# Patient Record
Sex: Male | Born: 1960 | Race: White | Hispanic: Yes | Marital: Single | Smoking: Never smoker
Health system: Southern US, Community
[De-identification: ages and names within clinical notes are randomized; demographics above are authoritative.]

## PROBLEM LIST (undated history)

## (undated) DIAGNOSIS — E559 Vitamin D deficiency, unspecified: Secondary | ICD-10-CM

## (undated) DIAGNOSIS — R519 Headache, unspecified: Secondary | ICD-10-CM

## (undated) DIAGNOSIS — E7800 Pure hypercholesterolemia, unspecified: Secondary | ICD-10-CM

## (undated) DIAGNOSIS — H547 Unspecified visual loss: Secondary | ICD-10-CM

## (undated) DIAGNOSIS — J45909 Unspecified asthma, uncomplicated: Secondary | ICD-10-CM

## (undated) DIAGNOSIS — R7303 Prediabetes: Secondary | ICD-10-CM

## (undated) DIAGNOSIS — K219 Gastro-esophageal reflux disease without esophagitis: Secondary | ICD-10-CM

## (undated) DIAGNOSIS — E039 Hypothyroidism, unspecified: Secondary | ICD-10-CM

## (undated) DIAGNOSIS — I10 Essential (primary) hypertension: Secondary | ICD-10-CM

## (undated) DIAGNOSIS — W3400XA Accidental discharge from unspecified firearms or gun, initial encounter: Secondary | ICD-10-CM

## (undated) DIAGNOSIS — M1990 Unspecified osteoarthritis, unspecified site: Secondary | ICD-10-CM

## (undated) DIAGNOSIS — Z79899 Other long term (current) drug therapy: Secondary | ICD-10-CM

## (undated) DIAGNOSIS — R0600 Dyspnea, unspecified: Secondary | ICD-10-CM

## (undated) DIAGNOSIS — F32A Depression, unspecified: Secondary | ICD-10-CM

## (undated) HISTORY — DX: Vitamin D deficiency, unspecified: E55.9

## (undated) HISTORY — DX: Pure hypercholesterolemia, unspecified: E78.00

## (undated) HISTORY — DX: Unspecified asthma, uncomplicated: J45.909

## (undated) HISTORY — DX: Hypothyroidism, unspecified: E03.9

## (undated) HISTORY — PX: OTHER SURGICAL HISTORY: SHX169

## (undated) HISTORY — DX: Other long term (current) drug therapy: Z79.899

---
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
## 1982-05-23 DIAGNOSIS — W3400XA Accidental discharge from unspecified firearms or gun, initial encounter: Secondary | ICD-10-CM

## 1982-05-23 HISTORY — DX: Accidental discharge from unspecified firearms or gun, initial encounter: W34.00XA

## 1982-05-23 HISTORY — PX: EYE SURGERY: SHX253

## 2018-01-15 ENCOUNTER — Encounter (HOSPITAL_BASED_OUTPATIENT_CLINIC_OR_DEPARTMENT_OTHER): Payer: Self-pay | Admitting: *Deleted

## 2018-01-15 ENCOUNTER — Emergency Department (HOSPITAL_BASED_OUTPATIENT_CLINIC_OR_DEPARTMENT_OTHER)
Admission: EM | Admit: 2018-01-15 | Discharge: 2018-01-15 | Disposition: A | Discharge status: Home / Self Care | Payer: Medicaid Other | Attending: Emergency Medicine | Admitting: Emergency Medicine

## 2018-01-15 ENCOUNTER — Other Ambulatory Visit: Payer: Self-pay

## 2018-01-15 DIAGNOSIS — D171 Benign lipomatous neoplasm of skin and subcutaneous tissue of trunk: Secondary | ICD-10-CM | POA: Diagnosis not present

## 2018-01-15 DIAGNOSIS — R1901 Right upper quadrant abdominal swelling, mass and lump: Secondary | ICD-10-CM | POA: Diagnosis present

## 2018-01-15 DIAGNOSIS — I10 Essential (primary) hypertension: Secondary | ICD-10-CM | POA: Insufficient documentation

## 2018-01-15 HISTORY — DX: Essential (primary) hypertension: I10

## 2018-01-15 HISTORY — DX: Unspecified visual loss: H54.7

## 2018-01-15 NOTE — ED Triage Notes (Signed)
He has 2 quarter sized enlarged areas in his right upper abdominal quadrant for 6 months.

## 2018-01-15 NOTE — ED Provider Notes (Signed)
[facility] EMERGENCY DEPARTMENT Provider Note   CSN: [id number] Arrival date & time: 01/15/18  1608     History   Chief Complaint Chief Complaint  Patient presents with  . Skin Lesion    HPI Jeffery [name] is a 57 y.o. male.  HPI Jeffery [name] is a 57 y.o. male with hx of blindness and hypertension, presents to emergency department complaining of 2 swollen areas to his abdomen.  Patient states the areas have been there for several weeks.  He states they are not tender.  There is no swelling or erythema over them.  He is concerned that this could be an abscess, states he had one on his neck and his face in the past.  Denies any fever or chills.  No nausea or vomiting.  Eating drinking well.  No other complaints.  Past Medical History:  Diagnosis Date  . Blind   . Hypertension     There are no active problems to display for this patient.   History reviewed. No pertinent surgical history.      Home Medications    Prior to Admission medications   Not on File    Family History No family history on file.  Social History Social History   Tobacco Use  . Smoking status: Never Smoker  . Smokeless tobacco: Never Used  Substance Use Topics  . Alcohol use: Yes    Frequency: Never  . Drug use: Never     Allergies   Patient has no known allergies.   Review of Systems Review of Systems  Constitutional: Negative for chills and fever.  Respiratory: Negative for cough, chest tightness and shortness of breath.   Cardiovascular: Negative for chest pain, palpitations and leg swelling.  Gastrointestinal: Negative for abdominal distention, abdominal pain, diarrhea, nausea and vomiting.  Musculoskeletal: Negative for arthralgias, myalgias, neck pain and neck stiffness.  Skin: Positive for wound. Negative for rash.  Allergic/Immunologic: Negative for immunocompromised state.  Neurological: Negative for dizziness, weakness, light-headedness, numbness and headaches.  All  other systems reviewed and are negative.    Physical Exam Updated Vital Signs BP (!) 128/95   Pulse 68   Temp 98.2 F (36.8 C) (Oral)   Resp 18   Ht 5\' 6"  (1.676 m)   Wt 84.8 kg   SpO2 99%   BMI 30.18 kg/m   Physical Exam  Constitutional: He appears well-developed and well-nourished. No distress.  Eyes: Conjunctivae are normal.  Neck: Neck supple.  Cardiovascular: Normal rate.  Pulmonary/Chest: No respiratory distress.  Abdominal: He exhibits no distension. There is no tenderness.    Two small superficial firm, mobile nodules to the right of the abdomen, most consistent with lipomas.  Area is nontender, no erythema, no drainage, is not warm to the touch.  No skin discoloration at all.  Skin: Skin is warm and dry.  Nursing note and vitals reviewed.    ED Treatments / Results  Labs (all labs ordered are listed, but only abnormal results are displayed) Labs Reviewed - No data to display  EKG None  Radiology No results found.  Procedures Procedures (including critical care time)  Medications Ordered in ED Medications - No data to display   Initial Impression / Assessment and Plan / ED Course  I have reviewed the triage vital signs and the nursing notes.  Pertinent labs & imaging results that were available during my care of the patient were reviewed by me and considered in my medical decision making (see chart for  details).     With what appears to be 2 lipomas to the abdomen.  They are nontender.  He is concerned this could be an abscess, however it is not warm, not erythematous, oval in shape, mobile, nontender.  We discussed observation.  Ibuprofen or Tylenol for pain if needed.  Follow-up with primary care doctor.  Patient agreed.  He has no systemic symptoms, no other complaints.  Vitals:   01/15/18 1615 01/15/18 1617  BP:  (!) 128/95  Pulse:  68  Resp:  18  Temp:  98.2 F (36.8 C)  TempSrc:  Oral  SpO2:  99%  Weight: 84.8 kg   Height: 5\' 6"   (1.676 m)      Final Clinical Impressions(s) / ED Diagnoses   Final diagnoses:  Lipoma of torso    ED Discharge Orders    None       Jeannett Senior, PA-C 01/15/18 2305    Fredia Sorrow, MD 01/21/18 [phone]

## 2018-01-15 NOTE — Discharge Instructions (Addendum)
Tylenol or Motrin for pain.  Avoid touching the area to stop the inflammation.  Follow-up with your doctor as needed.

## 2018-10-16 ENCOUNTER — Ambulatory Visit: Payer: Self-pay | Admitting: Orthopedic Surgery

## 2018-10-19 ENCOUNTER — Ambulatory Visit: Payer: Self-pay | Admitting: Orthopedic Surgery

## 2018-10-19 NOTE — H&P (Unsigned)
Subjective:   Jeffery [name] is a pleasant 58 yo male with no significant PMH who is scheduled for a left L5/S1 micro discectomy on 10/25/2018 at [facility] for L5/S1 discectomy with severe debilitating left leg pain which has not improved with medications. Fortunately he has no weakness or changes in his bladder or bowel function. However, the pain is 10/10, severe, and debilitating and the patient would like to move forward with surgery. Surgical clearance from PCP has been obtained. Patient has LSO brace.  Past Medical History:  Diagnosis Date  . Blind   . Hypertension     No past surgical history on file.  No current outpatient medications on file.   No current facility-administered medications for this visit.    No Known Allergies  Social History   Tobacco Use  . Smoking status: Never Smoker  . Smokeless tobacco: Never Used  Substance Use Topics  . Alcohol use: Yes    Frequency: Never    No family history on file.  Review of Systems As stated in HPI  Objective:   General: AAOX3, well developed and well nourished, NAD Ambulation antalgic uses no assitive devices  Heart: RRR, no rubs, murmers, or gallops.  Lungs: CTAB  Abdomen: Normal bowel soundsX4, soft, non-tender, no hepatosplenomegaly.  ROM: - Knee: flexion and extension normal and pain free bilaterally. - Ankle: Dorsiflexion, plantarflexion, inversion, eversion normal and pain free.  Dermatomes: Lower extremity sensation to light touch abnormal with positive dysathesias on the left L5/S1 dermatome pattern.  Myotomes: - Hip Flexion: Left 5/5, Right 5/5 -Hip Adduction: Left 5/5, Right 5/5 - Knee Extension: Left 5/5, Right 5/5 - Knee Flexion: Left 5/5, Right 5/5 - Ankle Dorsiflextion: Left 5/5, Right 5/5 - Ankle Eversion: Left 5/5, Right 5/5 - Ankle Plantarflexion: Left 5/5, Right 5/5  Reflexes: - Patella: Left2+, Right 2+ - Achilles: Left2+, Right 2+ - Babinski: Left Ngative, Right Negative - Clonus:  Negative  Special Tests:  - Straight Leg Raise: Left Positive, Right Negative  PV: Extremities warm and well profused. Posterior and dorsalis pedis pulse 2+ bilaterally, No pitting Edema, discoloration, calf tenderness, or palpable cords. Homan's negative bilaterally.    X-Ray impression: No x-rays at today's visit  Lumbar MRI: completed on 10/01/18 was reviewed with the patient. I have also reviewed the radiology report. Large left disc herniation measuring 13 x 14 x 17 mm posterior lateral to the left at L5-S1. There is significant displacement and compression of the traversing S1 nerve root. Small broad-based disc protrusions at L3-4 and L4-5 but no high-grade neural compression. No acute fracture seen. No evidence of abnormal marrow replacing lesion.  Assessment:   Jeffery [name] is a pleasant 58 yo male with no significant PMH who is scheduled for a left L5/S1 micro discectomy on 10/25/2018 at [facility] for L5/S1 discectomy with severe debilitating left leg pain which has not improved with medications. At this point time the patient has severe debilitating radicular leg pain consistent with S1 radiculopathy. Imaging studies confirmed a large left disc herniation causing compression of the S1 nerve root. We did briefly discussed injection therapy but given the size of the disc herniation and the severity of the nerve compression I am not optimistic that that treatment plan would result in significant improvement. The patient would like to move forward with surgery which I think gives him the best chance at relieving his radicular leg pain.  Plan:   Treatment plan: Left L5-S1 discectomy At this point time the patient has  severe debilitating radicular leg pain consistent with S1 radiculopathy. Imaging studies confirmed a large left disc herniation causing compression of the S1 nerve root. We did briefly discussed injection therapy but given the size of the disc herniation and the severity of the  nerve compression I am not optimistic that that treatment plan would result in significant improvement. The patient would like to move forward with surgery which I think gives him the best chance at relieving his radicular leg pain.  Surgical plan is L5-S1 discectomy. I have briefly gone over the surgical procedure as well as the risks and benefits. Risks and benefits of surgery were discussed with the patient. These include: Infection, bleeding, death, stroke, paralysis, ongoing or worse pain, need for additional surgery, leak of spinal fluid, adjacent segment degeneration requiring additional surgery, post-operative hematoma formation that can result in neurological compromise and the need for urgent/emergent re-operation. Loss in bowel and bladder control. Injury to major vessels that could result in the need for urgent abdominal surgery to stop bleeding. Risk of deep venous thrombosis (DVT) and the need for additional treatment. Recurrent disc herniation resulting in the need for revision surgery, which could include fusion surgery (utilizing instrumentation such as pedicle screws and intervertebral cages).  All of patients questions invited and answered.  Treatment plan: Left L5-S1 discectomy on 10/25/2018  F/u: 2 weeks s/p surgery.

## 2018-10-22 NOTE — Progress Notes (Signed)
[place], [place] [address] Phone: [phone] Fax: [phone]      Your procedure is scheduled on June 4  Report to [facility] Main Entrance "A" at 1130 A.M., and check in at the Admitting office.  Call this number if you have problems the morning of surgery:  [phone]  Call [phone] if you have any questions prior to your surgery date Monday-Friday 8am-4pm    Remember:  Do not eat or drink after midnight.     Take these medicines the morning of surgery with A SIP OF WATER  albuterol (VENTOLIN HFA) 108 (90 Base) if needed gabapentin (NEURONTIN)  oxyCODONE-acetaminophen (PERCOCET) if needed for pain Eye drops if needed  7 days prior to surgery STOP taking any Aspirin (unless otherwise instructed by your surgeon), Aleve, Naproxen, Ibuprofen, Motrin, Advil, Goody's, BC's, all herbal medications, fish oil, and all vitamins.    The Morning of Surgery  Do not wear jewelry, make-up or nail polish.  Do not wear lotions, powders, or perfumes/colognes, or deodorant  Do not shave 48 hours prior to surgery.  Men may shave face and neck.  Do not bring valuables to the hospital.  [facility] is not responsible for any belongings or valuables.  If you are a smoker, DO NOT Smoke 24 hours prior to surgery IF you wear a CPAP at night please bring your mask, tubing, and machine the morning of surgery   Remember that you must have someone to transport you home after your surgery, and remain with you for 24 hours if you are discharged the same day.   Contacts, glasses, hearing aids, dentures or bridgework may not be worn into surgery.    Leave your suitcase in the car.  After surgery it may be brought to your room.  For patients admitted to the hospital, discharge time will be determined by your treatment team.  Patients discharged the day of surgery will not be allowed  to drive home.    Special instructions:   [facility]- Preparing For Surgery  Before surgery, you can play an important role. Because skin is not sterile, your skin needs to be as free of germs as possible. You can reduce the number of germs on your skin by washing with CHG (chlorahexidine gluconate) Soap before surgery.  CHG is an antiseptic cleaner which kills germs and bonds with the skin to continue killing germs even after washing.    Oral Hygiene is also important to reduce your risk of infection.  Remember - BRUSH YOUR TEETH THE MORNING OF SURGERY WITH YOUR REGULAR TOOTHPASTE  Please do not use if you have an allergy to CHG or antibacterial soaps. If your skin becomes reddened/irritated stop using the CHG.  Do not shave (including legs and underarms) for at least 48 hours prior to first CHG shower. It is OK to shave your face.  Please follow these instructions carefully.   1. Shower the NIGHT BEFORE SURGERY and the MORNING OF SURGERY with CHG Soap.   2. If you chose to wash your hair, wash your hair first as usual with your normal shampoo.  3. After you shampoo, rinse your hair and body thoroughly to remove the shampoo.  4. Use CHG as you would any other liquid soap. You can apply CHG directly to the skin and wash gently with a scrungie or a clean washcloth.   5. Apply  the CHG Soap to your body ONLY FROM THE NECK DOWN.  Do not use on open wounds or open sores. Avoid contact with your eyes, ears, mouth and genitals (private parts). Wash Face and genitals (private parts)  with your normal soap.   6. Wash thoroughly, paying special attention to the area where your surgery will be performed.  7. Thoroughly rinse your body with warm water from the neck down.  8. DO NOT shower/wash with your normal soap after using and rinsing off the CHG Soap.  9. Pat yourself dry with a CLEAN TOWEL.  10. Wear CLEAN PAJAMAS to bed the night before surgery, wear comfortable clothes the morning of  surgery  11. Place CLEAN SHEETS on your bed the night of your first shower and DO NOT SLEEP WITH PETS.    Day of Surgery:  Do not apply any deodorants/lotions.  Please wear clean clothes to the hospital/surgery center.   Remember to brush your teeth WITH YOUR REGULAR TOOTHPASTE.   Please read over the following fact sheets that you were given.

## 2018-10-23 ENCOUNTER — Ambulatory Visit (HOSPITAL_COMMUNITY)
Admission: RE | Admit: 2018-10-23 | Discharge: 2018-10-23 | Disposition: A | Discharge status: Home / Self Care | Payer: Medicare Other | Source: Ambulatory Visit | Attending: Orthopedic Surgery | Admitting: Orthopedic Surgery

## 2018-10-23 ENCOUNTER — Other Ambulatory Visit (HOSPITAL_COMMUNITY)
Admission: RE | Admit: 2018-10-23 | Discharge: 2018-10-23 | Disposition: A | Discharge status: Home / Self Care | Payer: Medicare Other | Source: Ambulatory Visit | Attending: Orthopedic Surgery | Admitting: Orthopedic Surgery

## 2018-10-23 ENCOUNTER — Encounter (HOSPITAL_COMMUNITY)
Admission: RE | Admit: 2018-10-23 | Discharge: 2018-10-23 | Disposition: A | Discharge status: Home / Self Care | Payer: Medicare Other | Source: Ambulatory Visit | Attending: Orthopedic Surgery | Admitting: Orthopedic Surgery

## 2018-10-23 ENCOUNTER — Encounter (HOSPITAL_COMMUNITY): Payer: Self-pay

## 2018-10-23 ENCOUNTER — Other Ambulatory Visit: Payer: Self-pay

## 2018-10-23 DIAGNOSIS — Z01818 Encounter for other preprocedural examination: Secondary | ICD-10-CM | POA: Insufficient documentation

## 2018-10-23 DIAGNOSIS — J45909 Unspecified asthma, uncomplicated: Secondary | ICD-10-CM | POA: Diagnosis not present

## 2018-10-23 DIAGNOSIS — I10 Essential (primary) hypertension: Secondary | ICD-10-CM | POA: Insufficient documentation

## 2018-10-23 DIAGNOSIS — Z79899 Other long term (current) drug therapy: Secondary | ICD-10-CM | POA: Diagnosis not present

## 2018-10-23 DIAGNOSIS — H547 Unspecified visual loss: Secondary | ICD-10-CM | POA: Diagnosis not present

## 2018-10-23 DIAGNOSIS — Z1159 Encounter for screening for other viral diseases: Secondary | ICD-10-CM | POA: Diagnosis not present

## 2018-10-23 DIAGNOSIS — M5127 Other intervertebral disc displacement, lumbosacral region: Secondary | ICD-10-CM | POA: Diagnosis not present

## 2018-10-23 HISTORY — DX: Unspecified asthma, uncomplicated: J45.909

## 2018-10-23 LAB — PROTIME-INR
INR: 1.1 (ref 0.8–1.2)
Prothrombin Time: 14.1 seconds (ref 11.4–15.2)

## 2018-10-23 LAB — SURGICAL PCR SCREEN
MRSA, PCR: NEGATIVE
Staphylococcus aureus: NEGATIVE

## 2018-10-23 LAB — URINALYSIS, ROUTINE W REFLEX MICROSCOPIC
Bilirubin Urine: NEGATIVE
Glucose, UA: NEGATIVE mg/dL
Hgb urine dipstick: NEGATIVE
Ketones, ur: NEGATIVE mg/dL
Leukocytes,Ua: NEGATIVE
Nitrite: NEGATIVE
Protein, ur: NEGATIVE mg/dL
Specific Gravity, Urine: 1.017 (ref 1.005–1.030)
pH: 6 (ref 5.0–8.0)

## 2018-10-23 LAB — BASIC METABOLIC PANEL
Anion gap: 9 (ref 5–15)
BUN: 14 mg/dL (ref 6–20)
CO2: 25 mmol/L (ref 22–32)
Calcium: 9.3 mg/dL (ref 8.9–10.3)
Chloride: 106 mmol/L (ref 98–111)
Creatinine, Ser: 1.02 mg/dL (ref 0.61–1.24)
GFR calc Af Amer: >60 mL/min (ref >60)
GFR calc non Af Amer: >60 mL/min (ref >60)
Glucose, Bld: 77 mg/dL (ref 70–99)
Potassium: 4.1 mmol/L (ref 3.5–5.1)
Sodium: 140 mmol/L (ref 135–145)

## 2018-10-23 LAB — APTT: aPTT: 29 seconds (ref 24–36)

## 2018-10-23 LAB — CBC
HCT: 47.6 % (ref 39.0–52.0)
Hemoglobin: 16.2 g/dL (ref 13.0–17.0)
MCH: 32.7 pg (ref 26.0–34.0)
MCHC: 34 g/dL (ref 30.0–36.0)
MCV: 96 fL (ref 80.0–100.0)
Platelets: 206 10*3/uL (ref 150–400)
RBC: 4.96 MIL/uL (ref 4.22–5.81)
RDW: 11.4 % — ABNORMAL LOW (ref 11.5–15.5)
WBC: 7.1 10*3/uL (ref 4.0–10.5)
nRBC: 0 % (ref 0.0–0.2)

## 2018-10-23 NOTE — Progress Notes (Signed)
PCP - Kellie Shropshire Cardiologist - denies  Chest x-ray - 10/23/18 EKG - requesting Stress Test - denies ECHO - denies Cardiac Cath - denies   Anesthesia review: yes, records requested   Patient denies shortness of breath, fever, cough and chest pain at PAT appointment   Patient verbalized understanding of instructions that were given to them at the PAT appointment. Patient was also instructed that they will need to review over the PAT instructions again at home before surgery.

## 2018-10-24 ENCOUNTER — Encounter (HOSPITAL_COMMUNITY): Payer: Self-pay | Admitting: Vascular Surgery

## 2018-10-24 LAB — NOVEL CORONAVIRUS, NAA (HOSP ORDER, SEND-OUT TO REF LAB; TAT 18-24 HRS): SARS-CoV-2, NAA: NOT DETECTED

## 2018-10-24 NOTE — Anesthesia Preprocedure Evaluation (Deleted)
Anesthesia Evaluation    Airway        Dental   Pulmonary           Cardiovascular hypertension,      Neuro/Psych    GI/Hepatic   Endo/Other    Renal/GU      Musculoskeletal   Abdominal   Peds  Hematology   Anesthesia Other Findings   Reproductive/Obstetrics                             Anesthesia Physical Anesthesia Plan  ASA:   Anesthesia Plan:    Post-op Pain Management:    Induction:   PONV Risk Score and Plan:   Airway Management Planned:   Additional Equipment:   Intra-op Plan:   Post-operative Plan:   Informed Consent:   Plan Discussed with:   Anesthesia Plan Comments: (PAT note written 10/24/2018 by Myra Gianotti, PA-C. )        Anesthesia Quick Evaluation
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
## 2018-10-25 ENCOUNTER — Ambulatory Visit (HOSPITAL_COMMUNITY): Admission: RE | Admit: 2018-10-25 | Payer: Medicaid Other | Source: Home / Self Care | Admitting: Orthopedic Surgery

## 2018-10-25 ENCOUNTER — Encounter (HOSPITAL_COMMUNITY): Admission: RE | Payer: Self-pay | Source: Home / Self Care

## 2018-10-25 SURGERY — LUMBAR LAMINECTOMY/DECOMPRESSION MICRODISCECTOMY 1 LEVEL
Anesthesia: General | Laterality: Left

## 2018-12-11 ENCOUNTER — Encounter: Payer: Self-pay | Admitting: Physical Therapy

## 2018-12-11 ENCOUNTER — Other Ambulatory Visit: Payer: Self-pay

## 2018-12-11 ENCOUNTER — Ambulatory Visit: Payer: Medicare Other | Attending: Orthopedic Surgery | Admitting: Physical Therapy

## 2018-12-11 DIAGNOSIS — M5442 Lumbago with sciatica, left side: Secondary | ICD-10-CM | POA: Insufficient documentation

## 2018-12-11 DIAGNOSIS — M25652 Stiffness of left hip, not elsewhere classified: Secondary | ICD-10-CM | POA: Diagnosis present

## 2018-12-11 DIAGNOSIS — M6281 Muscle weakness (generalized): Secondary | ICD-10-CM | POA: Insufficient documentation

## 2018-12-11 NOTE — Patient Instructions (Signed)
Access Code: [email or web address]  URL: [email or web address]  Date: 12/11/2018  Prepared by: Earlie Counts   Exercises Supine Transversus Abdominis Bracing - Hands on Stomach - 5 reps - 1 sets - 5 sec hold - 1x daily - 7x weekly Supine Bridge - 10 reps - 1 sets - 1x daily - 7x weekly Supine Hamstring Stretch - 2 reps - 1 sets - 30 sec hold - 1x daily - 7x weekly [facility] [address], [place], [place] [address] Phone # [phone] Fax [phone]
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
## 2018-12-12 ENCOUNTER — Other Ambulatory Visit: Payer: Self-pay

## 2018-12-12 ENCOUNTER — Ambulatory Visit (INDEPENDENT_AMBULATORY_CARE_PROVIDER_SITE_OTHER): Payer: Medicare Other | Admitting: Neurology

## 2018-12-12 ENCOUNTER — Encounter: Payer: Self-pay | Admitting: Neurology

## 2018-12-12 VITALS — BP 138/94 | HR 74 | Ht 66.0 in | Wt 197.0 lb

## 2018-12-12 DIAGNOSIS — R51 Headache: Secondary | ICD-10-CM

## 2018-12-12 DIAGNOSIS — Z8489 Family history of other specified conditions: Secondary | ICD-10-CM

## 2018-12-12 DIAGNOSIS — R519 Headache, unspecified: Secondary | ICD-10-CM

## 2018-12-12 DIAGNOSIS — E669 Obesity, unspecified: Secondary | ICD-10-CM

## 2018-12-12 DIAGNOSIS — G4489 Other headache syndrome: Secondary | ICD-10-CM

## 2018-12-12 DIAGNOSIS — G479 Sleep disorder, unspecified: Secondary | ICD-10-CM

## 2018-12-12 NOTE — Patient Instructions (Signed)
Your headache description is not classic for migraines.  We will investigate further with a brain MRI with and without contrast as well as a sleep study to rule out obstructive sleep apnea as untreated sleep apnea can cause recurrent headaches including nighttime and morning headaches.  Please continue with the Topamax for now, it takes a while to kick in and certain side effects can improve over the first 2 or 3 weeks of starting it.You just started taking it last week. We will do blood work today to ensure your kidney function is good enough to pursue the MRI. We will keep you posted as to your test results and I will see you in follow-up after testing. Please remember, common headache triggers are: sleep deprivation, dehydration, overheating, stress, hypoglycemia or skipping meals and blood sugar fluctuations, excessive pain medications or excessive alcohol use or caffeine withdrawal. Some people have food triggers such as aged cheese, orange juice or chocolate, especially dark chocolate, or MSG (monosodium glutamate). Try to avoid these headache triggers as much possible. It may be helpful to keep a headache diary to figure out what makes your headaches worse or brings them on and what alleviates them. Some people report headache onset after exercise but studies have shown that regular exercise may actually prevent headaches from coming. If you have exercise-induced headaches, please make sure that you drink plenty of fluid before and after exercising and that you do not over do it and do not overheat.
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
## 2018-12-13 ENCOUNTER — Telehealth: Payer: Self-pay

## 2018-12-13 ENCOUNTER — Telehealth: Payer: Self-pay | Admitting: Neurology

## 2018-12-13 LAB — COMPREHENSIVE METABOLIC PANEL
ALT: 51 IU/L — ABNORMAL HIGH (ref 0–44)
AST: 21 IU/L (ref 0–40)
Albumin/Globulin Ratio: 2.4 — ABNORMAL HIGH (ref 1.2–2.2)
Albumin: 5 g/dL — ABNORMAL HIGH (ref 3.8–4.9)
Alkaline Phosphatase: 70 IU/L (ref 39–117)
BUN/Creatinine Ratio: 16 (ref 9–20)
BUN: 18 mg/dL (ref 6–24)
Bilirubin Total: 0.6 mg/dL (ref 0.0–1.2)
CO2: 24 mmol/L (ref 20–29)
Calcium: 9.9 mg/dL (ref 8.7–10.2)
Chloride: 101 mmol/L (ref 96–106)
Creatinine, Ser: 1.14 mg/dL (ref 0.76–1.27)
GFR calc Af Amer: 82 mL/min/{1.73_m2} (ref >59)
GFR calc non Af Amer: 71 mL/min/{1.73_m2} (ref >59)
Globulin, Total: 2.1 g/dL (ref 1.5–4.5)
Glucose: 88 mg/dL (ref 65–99)
Potassium: 4.3 mmol/L (ref 3.5–5.2)
Sodium: 143 mmol/L (ref 134–144)
Total Protein: 7.1 g/dL (ref 6.0–8.5)

## 2018-12-13 NOTE — Progress Notes (Signed)
Kidney function good; okay to proceed with br MRI w/wo contrast. One of the liver enzymes is very mildly elevated: ALT at 51, probably due to a combination of factors, which can include fatty liver and taking several medications long-term. No immediate action is required, but worth monitoring with PCP. Please notify pt. Michel Bickers

## 2018-12-13 NOTE — Telephone Encounter (Signed)
I called pt and discussed his results and recommendations. Pt verbalized understanding of results. Pt had no questions at this time but was encouraged to call back if questions arise.

## 2018-12-13 NOTE — Telephone Encounter (Signed)
-----   Message from [name], MD sent at 12/13/2018  8:13 AM EDT ----- Kidney function good; okay to proceed with br MRI w/wo contrast. One of the liver enzymes is very mildly elevated: ALT at 51, probably due to a combination of factors, which can include fatty liver and taking several medications long-term. No immediate action is required, but worth monitoring with PCP. Please notify pt. Jeffery [name]

## 2018-12-13 NOTE — Telephone Encounter (Signed)
[employer] medicare/medicaid order sent to [facility]. They will obtain the auth and reach out to the patient to schedule.

## 2018-12-27 ENCOUNTER — Ambulatory Visit: Payer: Medicare Other | Attending: Orthopedic Surgery | Admitting: Physical Therapy

## 2018-12-27 ENCOUNTER — Other Ambulatory Visit: Payer: Self-pay

## 2018-12-27 ENCOUNTER — Encounter: Payer: Self-pay | Admitting: Physical Therapy

## 2018-12-27 DIAGNOSIS — M25652 Stiffness of left hip, not elsewhere classified: Secondary | ICD-10-CM | POA: Diagnosis present

## 2018-12-27 DIAGNOSIS — M6281 Muscle weakness (generalized): Secondary | ICD-10-CM | POA: Insufficient documentation

## 2018-12-27 DIAGNOSIS — M5442 Lumbago with sciatica, left side: Secondary | ICD-10-CM | POA: Insufficient documentation

## 2018-12-27 NOTE — Patient Instructions (Signed)
Access Code: [email or web address]  URL: [email or web address]  Date: 12/27/2018  Prepared by: Sherol Dade   Exercises  Hip Flexion Stretch - 5 reps - 10 hold - 2x daily - 7x weekly  Sidelying Thoracic Lumbar Rotation - 15 reps - 2x daily - 7x weekly  Supine Figure 4 Piriformis Stretch - 3 reps - 30 hold - 2x daily - 7x weekly  Figure 4 Gluteus Mobilization on Foam Roll - 2-3x daily - 7x weekly    [facility] [address] [place], [place] [address] Phone # [phone] Fax [phone]
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
## 2018-12-28 ENCOUNTER — Ambulatory Visit: Payer: Medicare Other | Admitting: Physical Therapy

## 2018-12-28 ENCOUNTER — Encounter: Payer: Self-pay | Admitting: Physical Therapy

## 2018-12-28 ENCOUNTER — Other Ambulatory Visit: Payer: Self-pay

## 2018-12-28 DIAGNOSIS — M5442 Lumbago with sciatica, left side: Secondary | ICD-10-CM | POA: Diagnosis not present

## 2018-12-28 DIAGNOSIS — M25652 Stiffness of left hip, not elsewhere classified: Secondary | ICD-10-CM

## 2018-12-28 DIAGNOSIS — M6281 Muscle weakness (generalized): Secondary | ICD-10-CM
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
## 2018-12-28 NOTE — Patient Instructions (Signed)
Access Code: [email or web address]  URL: [email or web address]  Date: 12/28/2018  Prepared by: Earlie Counts   Exercises Hip Flexion Stretch - 5 reps - 10 hold - 2x daily - 7x weekly Sidelying Thoracic Lumbar Rotation - 15 reps - 2x daily - 7x weekly Supine Figure 4 Piriformis Stretch - 3 reps - 30 hold - 2x daily - 7x weekly Figure 4 Gluteus Mobilization on Foam Roll - 2-3x daily - 7x weekly Bent Knee Fallouts - 10 reps - 1 sets - 1x daily - 7x weekly Hooklying Isometric Hip Flexion - 10 reps - 1 sets - 5 sec hold - 1x daily - 7x weekly Supine Bridge - 10 reps - 1 sets - 1x daily - 7x weekly Clamshell - 10 reps - 1 sets - 1x daily - 7x weekly [facility] [address] [place], [place] [address] Phone # [phone] Fax [phone]

## 2019-01-01 ENCOUNTER — Encounter: Payer: Self-pay | Admitting: Physical Therapy

## 2019-01-01 ENCOUNTER — Ambulatory Visit: Payer: Medicare Other | Admitting: Physical Therapy

## 2019-01-01 ENCOUNTER — Other Ambulatory Visit: Payer: Self-pay

## 2019-01-01 DIAGNOSIS — M5442 Lumbago with sciatica, left side: Secondary | ICD-10-CM | POA: Diagnosis not present

## 2019-01-01 DIAGNOSIS — M25652 Stiffness of left hip, not elsewhere classified: Secondary | ICD-10-CM

## 2019-01-01 DIAGNOSIS — M6281 Muscle weakness (generalized): Secondary | ICD-10-CM
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
## 2019-01-01 NOTE — Telephone Encounter (Signed)
[employer] medicare auth: NPR per [employer]

## 2019-01-03 ENCOUNTER — Other Ambulatory Visit: Payer: Self-pay

## 2019-01-03 ENCOUNTER — Ambulatory Visit: Payer: Medicare Other | Admitting: Physical Therapy

## 2019-01-03 ENCOUNTER — Encounter: Payer: Self-pay | Admitting: Physical Therapy

## 2019-01-03 DIAGNOSIS — M25652 Stiffness of left hip, not elsewhere classified: Secondary | ICD-10-CM

## 2019-01-03 DIAGNOSIS — M6281 Muscle weakness (generalized): Secondary | ICD-10-CM

## 2019-01-03 DIAGNOSIS — M5442 Lumbago with sciatica, left side: Secondary | ICD-10-CM | POA: Diagnosis not present
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
## 2019-01-07 ENCOUNTER — Ambulatory Visit
Admission: RE | Admit: 2019-01-07 | Discharge: 2019-01-07 | Disposition: A | Discharge status: Home / Self Care | Payer: Medicare Other | Source: Ambulatory Visit | Attending: Neurology | Admitting: Neurology

## 2019-01-07 ENCOUNTER — Other Ambulatory Visit: Payer: Self-pay

## 2019-01-07 DIAGNOSIS — G4489 Other headache syndrome: Secondary | ICD-10-CM

## 2019-01-07 MED ORDER — GADOBENATE DIMEGLUMINE 529 MG/ML IV SOLN
18.0000 mL | Freq: Once | INTRAVENOUS | Status: AC | PRN
  Administered 2019-01-07: 18 mL via INTRAVENOUS

## 2019-01-08 ENCOUNTER — Ambulatory Visit (INDEPENDENT_AMBULATORY_CARE_PROVIDER_SITE_OTHER): Payer: Medicare Other | Admitting: Neurology

## 2019-01-08 DIAGNOSIS — G479 Sleep disorder, unspecified: Secondary | ICD-10-CM

## 2019-01-08 DIAGNOSIS — G4733 Obstructive sleep apnea (adult) (pediatric): Secondary | ICD-10-CM | POA: Diagnosis not present

## 2019-01-08 DIAGNOSIS — E669 Obesity, unspecified: Secondary | ICD-10-CM

## 2019-01-08 DIAGNOSIS — G4489 Other headache syndrome: Secondary | ICD-10-CM

## 2019-01-08 DIAGNOSIS — Z8489 Family history of other specified conditions: Secondary | ICD-10-CM

## 2019-01-08 DIAGNOSIS — G4720 Circadian rhythm sleep disorder, unspecified type: Secondary | ICD-10-CM

## 2019-01-08 DIAGNOSIS — R519 Headache, unspecified: Secondary | ICD-10-CM

## 2019-01-08 NOTE — Progress Notes (Signed)
Brain MRI w/wo contrast shows normal for age findings, reassuring. Please update pt. He is scheduled for a sleep study tonight.

## 2019-01-09 ENCOUNTER — Telehealth: Payer: Self-pay

## 2019-01-09 NOTE — Telephone Encounter (Signed)
I called pt to discuss. Pt's wife answered, pt is in shower, I will call back later.

## 2019-01-09 NOTE — Telephone Encounter (Signed)
I called pt, advised him of the MRI results and recommendations. Pt verbalized understanding of results. Pt had no questions at this time but was encouraged to call back if questions arise.

## 2019-01-09 NOTE — Telephone Encounter (Signed)
-----   Message from [name], MD sent at 01/08/2019  7:54 AM EDT ----- Brain MRI w/wo contrast shows normal for age findings, reassuring. Please update pt. He is scheduled for a sleep study tonight.

## 2019-01-09 NOTE — Telephone Encounter (Signed)
Pt left a message on my voicemail returning your call.

## 2019-01-10 ENCOUNTER — Other Ambulatory Visit: Payer: Self-pay

## 2019-01-10 ENCOUNTER — Ambulatory Visit: Payer: Medicare Other | Admitting: Physical Therapy

## 2019-01-10 ENCOUNTER — Encounter: Payer: Self-pay | Admitting: Physical Therapy

## 2019-01-10 DIAGNOSIS — M5442 Lumbago with sciatica, left side: Secondary | ICD-10-CM

## 2019-01-10 DIAGNOSIS — M6281 Muscle weakness (generalized): Secondary | ICD-10-CM

## 2019-01-10 DIAGNOSIS — M25652 Stiffness of left hip, not elsewhere classified: Secondary | ICD-10-CM
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
## 2019-01-11 ENCOUNTER — Ambulatory Visit: Payer: Medicare Other | Admitting: Physical Therapy

## 2019-01-11 ENCOUNTER — Encounter: Payer: Self-pay | Admitting: Physical Therapy

## 2019-01-11 DIAGNOSIS — M25652 Stiffness of left hip, not elsewhere classified: Secondary | ICD-10-CM

## 2019-01-11 DIAGNOSIS — M5442 Lumbago with sciatica, left side: Secondary | ICD-10-CM | POA: Diagnosis not present

## 2019-01-11 DIAGNOSIS — M6281 Muscle weakness (generalized): Secondary | ICD-10-CM

## 2019-01-11 NOTE — Patient Instructions (Addendum)
Trigger Point Dry Needling  . What is Trigger Point Dry Needling (DN)? o DN is a physical therapy technique used to treat muscle pain and dysfunction. Specifically, DN helps deactivate muscle trigger points (muscle knots).  o A thin filiform needle is used to penetrate the skin and stimulate the underlying trigger point. The goal is for a local twitch response (LTR) to occur and for the trigger point to relax. No medication of any kind is injected during the procedure.   . What Does Trigger Point Dry Needling Feel Like?  o The procedure feels different for each individual patient. Some patients report that they do not actually feel the needle enter the skin and overall the process is not painful. Very mild bleeding may occur. However, many patients feel a deep cramping in the muscle in which the needle was inserted. This is the local twitch response.   Marland Kitchen How Will I feel after the treatment? o Soreness is normal, and the onset of soreness may not occur for a few hours. Typically this soreness does not last longer than two days.  o Bruising is uncommon, however; ice can be used to decrease any possible bruising.  o In rare cases feeling tired or nauseous after the treatment is normal. In addition, your symptoms may get worse before they get better, this period will typically not last longer than 24 hours.   . What Can I do After My Treatment? o Increase your hydration by drinking more water for the next 24 hours. o You may place ice or heat on the areas treated that have become sore, however, do not use heat on inflamed or bruised areas. Heat often brings more relief post needling. o You can continue your regular activities, but vigorous activity is not recommended initially after the treatment for 24 hours. o DN is best combined with other physical therapy such as strengthening, stretching, and other therapies.    [facility] [address], [place], [place]  [address] Phone # [phone] Fax [phone]  [facility] [place]
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
## 2019-01-15 ENCOUNTER — Encounter: Payer: Self-pay | Admitting: Physical Therapy

## 2019-01-15 ENCOUNTER — Other Ambulatory Visit: Payer: Self-pay

## 2019-01-15 ENCOUNTER — Ambulatory Visit: Payer: Medicare Other | Admitting: Physical Therapy

## 2019-01-15 ENCOUNTER — Telehealth: Payer: Self-pay

## 2019-01-15 DIAGNOSIS — M6281 Muscle weakness (generalized): Secondary | ICD-10-CM

## 2019-01-15 DIAGNOSIS — M5442 Lumbago with sciatica, left side: Secondary | ICD-10-CM | POA: Diagnosis not present

## 2019-01-15 DIAGNOSIS — M25652 Stiffness of left hip, not elsewhere classified: Secondary | ICD-10-CM

## 2019-01-15 NOTE — Telephone Encounter (Signed)
-----   Message from [name], MD sent at 01/15/2019  8:51 AM EDT ----- Patient referred by [name], seen by me on 12/12/18, diagnostic PSG on 01/08/19.   Please call and notify the patient that the recent sleep study showed severe obstructive sleep apnea. I recommend treatment for this in the form of CPAP. This will require a repeat sleep study for proper titration and mask fitting and correct monitoring of the oxygen saturations. Please explain to patient. I have placed an order in the chart. Thanks.  [name], MD, [name] [facility])
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
## 2019-01-15 NOTE — Telephone Encounter (Signed)
I called pt to discuss his sleep study results. No answer, left a message asking him to call me back. 

## 2019-01-15 NOTE — Addendum Note (Signed)
Addended by: [name] on: 01/15/2019 08:51 AM   Modules accepted: Orders
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
## 2019-01-15 NOTE — Progress Notes (Signed)
Patient referred by [name], seen by me on 12/12/18, diagnostic PSG on 01/08/19.   Please call and notify the patient that the recent sleep study showed severe obstructive sleep apnea. I recommend treatment for this in the form of CPAP. This will require a repeat sleep study for proper titration and mask fitting and correct monitoring of the oxygen saturations. Please explain to patient. I have placed an order in the chart. Thanks.  [name], MD, [name] [facility])

## 2019-01-17 ENCOUNTER — Other Ambulatory Visit: Payer: Self-pay

## 2019-01-17 ENCOUNTER — Ambulatory Visit: Payer: Medicare Other | Admitting: Physical Therapy

## 2019-01-17 ENCOUNTER — Encounter: Payer: Self-pay | Admitting: Physical Therapy

## 2019-01-17 DIAGNOSIS — M25652 Stiffness of left hip, not elsewhere classified: Secondary | ICD-10-CM

## 2019-01-17 DIAGNOSIS — M5442 Lumbago with sciatica, left side: Secondary | ICD-10-CM | POA: Diagnosis not present

## 2019-01-17 DIAGNOSIS — M6281 Muscle weakness (generalized): Secondary | ICD-10-CM
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
## 2019-01-22 ENCOUNTER — Encounter: Payer: Self-pay | Admitting: Physical Therapy

## 2019-01-22 ENCOUNTER — Ambulatory Visit: Payer: Medicare Other | Attending: Orthopedic Surgery | Admitting: Physical Therapy

## 2019-01-22 ENCOUNTER — Other Ambulatory Visit: Payer: Self-pay

## 2019-01-22 DIAGNOSIS — M25652 Stiffness of left hip, not elsewhere classified: Secondary | ICD-10-CM | POA: Diagnosis present

## 2019-01-22 DIAGNOSIS — M5442 Lumbago with sciatica, left side: Secondary | ICD-10-CM | POA: Diagnosis not present

## 2019-01-22 DIAGNOSIS — M6281 Muscle weakness (generalized): Secondary | ICD-10-CM | POA: Diagnosis present
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
## 2019-01-24 ENCOUNTER — Ambulatory Visit: Payer: Medicare Other | Admitting: Physical Therapy

## 2019-01-24 ENCOUNTER — Encounter: Payer: Self-pay | Admitting: Physical Therapy

## 2019-01-24 ENCOUNTER — Other Ambulatory Visit: Payer: Self-pay

## 2019-01-24 DIAGNOSIS — M5442 Lumbago with sciatica, left side: Secondary | ICD-10-CM | POA: Diagnosis not present

## 2019-01-24 DIAGNOSIS — M6281 Muscle weakness (generalized): Secondary | ICD-10-CM

## 2019-01-24 DIAGNOSIS — M25652 Stiffness of left hip, not elsewhere classified: Secondary | ICD-10-CM

## 2019-01-24 NOTE — Patient Instructions (Signed)
Access Code: [email or web address]  URL: [email or web address]  Date: 01/24/2019  Prepared by: Sherol Dade   Exercises  Hip Flexion Stretch - 5 reps - 10 hold - 2x daily - 7x weekly  Sidelying Thoracic Lumbar Rotation - 15 reps - 2x daily - 7x weekly  Supine Figure 4 Piriformis Stretch - 3 reps - 30 hold - 2x daily - 7x weekly  Hooklying Isometric Hip Flexion - 10 reps - 1 sets - 5 sec hold - 1x daily - 7x weekly  Goblet Squat with Kettlebell - 10-15 reps - 2 sets - 1x daily - 7x weekly  Supine Hip Adductor Stretch - 5 reps - 10 hold - 1x daily - 7x weekly  Beginner Front Arm Support - 10 reps - 1x daily - 7x weekly  Plank on Knees - 5 reps - 10 hold - 1x daily - 7x weekly    [facility] [address] [place], [place] [address] Phone # [phone] Fax [phone]
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
## 2019-01-29 ENCOUNTER — Ambulatory Visit: Payer: Medicare Other | Admitting: Physical Therapy

## 2019-01-29 ENCOUNTER — Encounter: Payer: Self-pay | Admitting: Physical Therapy

## 2019-01-29 ENCOUNTER — Other Ambulatory Visit: Payer: Self-pay

## 2019-01-29 DIAGNOSIS — M6281 Muscle weakness (generalized): Secondary | ICD-10-CM

## 2019-01-29 DIAGNOSIS — M5442 Lumbago with sciatica, left side: Secondary | ICD-10-CM

## 2019-01-29 DIAGNOSIS — M25652 Stiffness of left hip, not elsewhere classified: Secondary | ICD-10-CM
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
## 2019-01-31 NOTE — Telephone Encounter (Signed)
I called pt. I advised pt that Dr. Rexene Alberts reviewed their sleep study results and found that pt has severe osa and recommends that pt be treated with a cpap. Dr. Rexene Alberts recommends that pt return for a repeat sleep study in order to properly titrate the cpap and ensure a good mask fit. Pt is agreeable to returning for a titration study. I advised pt that our sleep lab will file with pt's insurance and call pt to schedule the sleep study when we hear back from the pt's insurance regarding coverage of this sleep study. Pt verbalized understanding of results.    Pt wants our sleep lab to call him to discuss the insurance coverage of his last sleep study.

## 2019-02-01 ENCOUNTER — Ambulatory Visit: Payer: Medicare Other | Admitting: Physical Therapy

## 2019-02-11 ENCOUNTER — Telehealth: Payer: Self-pay

## 2019-02-11 NOTE — Telephone Encounter (Signed)
Pt is having insurance issues with bill from [employer]. Pt has talked with billing manager Angie and he is in contact with [employer] as well. Pt does not want to schedule CPAP until bill issues are figured out. Pt to call sleep lab back when ready to schedule.

## 2019-08-30 ENCOUNTER — Ambulatory Visit: Payer: Medicare Other | Attending: Internal Medicine

## 2019-08-30 DIAGNOSIS — Z23 Encounter for immunization: Secondary | ICD-10-CM

## 2019-08-30 NOTE — Progress Notes (Signed)
   Covid-19 Vaccination Clinic  Name:  Jeffery [name]    MRN: [id number] DOB: 11-08-1960  08/30/2019  [name] was observed post Covid-19 immunization for 15 minutes without incident. He was provided with Vaccine Information Sheet and instruction to access the V-Safe system.   [name] was instructed to call 911 with any severe reactions post vaccine: Marland Kitchen Difficulty breathing  . Swelling of face and throat  . A fast heartbeat  . A bad rash all over body  . Dizziness and weakness   Immunizations Administered    Name Date Dose VIS Date Route   Pfizer COVID-19 Vaccine 08/30/2019 11:15 AM 0.3 mL 05/03/2019 Intramuscular   Manufacturer: [employer]   Lot: [id number]   [place]: [id number]

## 2019-09-24 ENCOUNTER — Ambulatory Visit: Payer: Medicare Other | Attending: Internal Medicine

## 2019-09-24 DIAGNOSIS — Z23 Encounter for immunization: Secondary | ICD-10-CM

## 2019-09-24 NOTE — Progress Notes (Signed)
   Covid-19 Vaccination Clinic  Name:  Jeffery [name]    MRN: [id number] DOB: [birth year]  09/24/2019  [name] was observed post Covid-19 immunization for 15 minutes without incident. He was provided with Vaccine Information Sheet and instruction to access the V-Safe system.   [name] was instructed to call 911 with any severe reactions post vaccine: Marland Kitchen Difficulty breathing  . Swelling of face and throat  . A fast heartbeat  . A bad rash all over body  . Dizziness and weakness   Immunizations Administered    Name Date Dose VIS Date Route   Pfizer COVID-19 Vaccine 09/24/2019 10:59 AM 0.3 mL 07/17/2018 Intramuscular   Manufacturer: [place]   Lot: [id number]   [place]: [id number]

## 2020-02-11 ENCOUNTER — Encounter: Payer: Self-pay | Admitting: Neurology

## 2020-02-11 ENCOUNTER — Ambulatory Visit (INDEPENDENT_AMBULATORY_CARE_PROVIDER_SITE_OTHER): Payer: Medicare Other | Admitting: Neurology

## 2020-02-11 VITALS — BP 126/74 | HR 61

## 2020-02-11 DIAGNOSIS — G4733 Obstructive sleep apnea (adult) (pediatric): Secondary | ICD-10-CM

## 2020-02-11 DIAGNOSIS — R519 Headache, unspecified: Secondary | ICD-10-CM
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
## 2020-02-11 NOTE — Patient Instructions (Signed)
We will set you up at home with a so called autoPAP machine for treatment of your overall obstructive sleep apnea.   We will set your machine up through a local DME company.  They will fit you with a mask at the time of your pickup.  Please follow-up in 3 months to see one of our nurse practitioners so we can talk about your symptoms and your compliance with the machine.  Please use your machine every night, all night, as much as possible.  Your sleep study from 01/08/2019 showed evidence of severe sleep apnea.

## 2020-04-09 ENCOUNTER — Telehealth: Payer: Self-pay | Admitting: Neurology

## 2020-04-09 NOTE — Telephone Encounter (Signed)
LVM advising patient to call [facility], left [phone]. Advised him I have sent community message to [facility] to let them know he will be getting in touch with them about his auto PAP.

## 2020-04-09 NOTE — Telephone Encounter (Signed)
Pt. states he needs to know where to get autoPAP machine from. He states there is no address or anything for his pickup. Please advise.

## 2020-04-09 NOTE — Telephone Encounter (Signed)
Pt called and the # to [facility] was given to him, this is Pharmacist, [facility] for [place] C,RN

## 2020-04-28 ENCOUNTER — Telehealth: Payer: Self-pay | Admitting: Neurology

## 2020-04-28 NOTE — Telephone Encounter (Signed)
Pt called back, informed patient of telephone note as instructed. Pt confirmed understood.

## 2020-04-28 NOTE — Telephone Encounter (Signed)
I called pt to advise him of this. If pt calls back please give him this message regarding his cpap and [place].

## 2020-04-28 NOTE — Telephone Encounter (Signed)
I spoke with Jeffery [name] at [facility]. We sent his auto pap order on 02/11/2020. His appt with [facility] to pick up his auto on 11/22 was cancelled because of no inventory and they texted him this morning that he will not be able to get a cpap until mid to late January.  I asked Jeffery [name] to investigate this further. He will have waited 4 months for a cpap with moderate to severe osa at that point.

## 2020-04-28 NOTE — Telephone Encounter (Signed)
Pt called he is having issues getting his cpap machine order filled

## 2020-04-28 NOTE — Telephone Encounter (Signed)
Received this message from [place]: "I got him moved up on the list, please let him know he will get in asap and someone will be calling him soon. "

## 2020-04-28 NOTE — Telephone Encounter (Signed)
I called pt. He was scheduled to pick up his 11/22 from [facility]. However, [facility] called them say they haven't gotten any in stock. I reminded him of the national shortage of cpaps. All DMEs have reduced inventory and shipments. I encouraged him to have close follow up with [facility]. Pt's January appt was rescheduled for 07/01/20 at 2:00pm with Dr. Rexene Alberts. Pt verbalized understanding of recommendations and of new appt date and time. I will also Jeneen Rinks, Freight forwarder at [facility] know.

## 2020-05-18 ENCOUNTER — Ambulatory Visit: Payer: Medicare Other | Attending: Internal Medicine

## 2020-05-18 DIAGNOSIS — Z23 Encounter for immunization: Secondary | ICD-10-CM

## 2020-05-18 NOTE — Progress Notes (Signed)
   Covid-19 Vaccination Clinic  Name:  Demetris Capell    MRN: [id number] DOB: [birth year]  05/18/2020  Mr. Krisher was observed post Covid-19 immunization for 15 minutes without incident. He was provided with Vaccine Information Sheet and instruction to access the V-Safe system.   Mr. Membreno was instructed to call 911 with any severe reactions post vaccine: Marland Kitchen Difficulty breathing  . Swelling of face and throat  . A fast heartbeat  . A bad rash all over body  . Dizziness and weakness   Immunizations Administered    Name Date Dose VIS Date Route   Pfizer COVID-19 Vaccine 05/18/2020  1:11 PM 0.3 mL 03/11/2020 Intramuscular   Manufacturer: [employer], [employer]   Lot: [id number]   NDC: [address]-8

## 2020-06-03 ENCOUNTER — Ambulatory Visit: Payer: Medicare Other | Admitting: Adult Health

## 2020-06-29 ENCOUNTER — Telehealth: Payer: Self-pay | Admitting: Neurology

## 2020-06-29 NOTE — Telephone Encounter (Signed)
Pt called, need sooner for my new user CPAP machine April. Would like a call from the nurse.

## 2020-06-29 NOTE — Telephone Encounter (Signed)
I reached out to the patient and rescheduled him for 07/02/2020 with Dr. Rexene Alberts at 9 AM.  Pt has been on cpap since 05/20/2021.April appointment has been canceled.

## 2020-07-01 ENCOUNTER — Ambulatory Visit: Payer: Self-pay | Admitting: Neurology

## 2020-07-02 ENCOUNTER — Ambulatory Visit (INDEPENDENT_AMBULATORY_CARE_PROVIDER_SITE_OTHER): Payer: Medicare Other | Admitting: Neurology

## 2020-07-02 ENCOUNTER — Encounter: Payer: Self-pay | Admitting: Neurology

## 2020-07-02 VITALS — BP 118/82 | HR 69 | Ht 66.0 in | Wt 209.0 lb

## 2020-07-02 DIAGNOSIS — G4733 Obstructive sleep apnea (adult) (pediatric): Secondary | ICD-10-CM

## 2020-07-02 DIAGNOSIS — R519 Headache, unspecified: Secondary | ICD-10-CM | POA: Diagnosis not present

## 2020-07-02 DIAGNOSIS — Z9989 Dependence on other enabling machines and devices: Secondary | ICD-10-CM | POA: Diagnosis not present

## 2020-07-02 NOTE — Patient Instructions (Signed)
You have done a great job using your AutoPap.  Please try to put it back on it after you go to the bathroom at night or if you have an awakening for another reason, try to be consistent with using it through the night all night.  I am glad to hear that your headaches are little better and your sleep consolidation and bathroom breaks at night have improved.  Please continue using your autoPAP regularly. While your insurance requires that you use PAP at least 4 hours each night on 70% of the nights, I recommend, that you not skip any nights and use it throughout the night if you can. Getting used to PAP and staying with the treatment long term does take time and patience and discipline. Untreated obstructive sleep apnea when it is moderate to severe can have an adverse impact on cardiovascular health and raise her risk for heart disease, arrhythmias, hypertension, congestive heart failure, stroke and diabetes. Untreated obstructive sleep apnea causes sleep disruption, nonrestorative sleep, and sleep deprivation. This can have an impact on your day to day functioning and cause daytime sleepiness and impairment of cognitive function, memory loss, mood disturbance, and problems focussing. Using PAP regularly can improve these symptoms.  We can see you in 6 months, you can see one of our nurse practitioners, hopefully if you continue to do well, you can be seen on a yearly basis after that.   As discussed, we will keep your pressure settings the same, your air leakage has improved after you switched to a nasal mask.
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
## 2020-08-27 ENCOUNTER — Ambulatory Visit: Payer: Self-pay | Admitting: Neurology

## 2020-09-01 ENCOUNTER — Telehealth: Payer: Self-pay | Admitting: Neurology

## 2020-09-01 NOTE — Telephone Encounter (Signed)
I have printed compliance data and have faxed to [phone]. Confirmation received.

## 2020-09-01 NOTE — Telephone Encounter (Signed)
Pt called stating that he has finished his 90 days with the cpap on March 28th and the cpap company informed him that the office is needing to send the print out of the 90 day compliance to them. It needs to be faxed to  [employer]  Fax # [phone] Please advise.

## 2020-10-19 ENCOUNTER — Telehealth: Payer: Self-pay | Admitting: Neurology

## 2020-10-19 DIAGNOSIS — G4733 Obstructive sleep apnea (adult) (pediatric): Secondary | ICD-10-CM

## 2020-10-19 NOTE — Telephone Encounter (Signed)
Patient called on call complains of skin irritation surrounding the cpap strap. Suggested him to replace with cloth strap or avoid CPAP to prevent worsening skin irritation  Please call to check on him

## 2020-10-20 NOTE — Telephone Encounter (Signed)
I called the pt and advised of Dr. Tori Milks recommendation and he verbalized understanding he will f/u with his DME on options to help with skin irritation.

## 2020-10-20 NOTE — Telephone Encounter (Signed)
Please call patient and advise him to make an appointment with his DME company for a mask refit.  He may have a reaction to silicone if he currently has a silicone rimmed mask.  If the skin irritation is around the headgear, they do make covers for the headgear or he may need a different material for the headgear.  He can talk to his DME company about options.

## 2020-10-20 NOTE — Addendum Note (Signed)
Addended by: [name] on: 10/20/2020 06:56 AM   Modules accepted: Orders

## 2020-10-29 ENCOUNTER — Emergency Department (HOSPITAL_COMMUNITY)
Admission: EM | Admit: 2020-10-29 | Discharge: 2020-10-29 | Disposition: A | Discharge status: Home / Self Care | Payer: Medicare (Managed Care) | Attending: Emergency Medicine | Admitting: Emergency Medicine

## 2020-10-29 ENCOUNTER — Encounter (HOSPITAL_COMMUNITY): Payer: Self-pay | Admitting: Emergency Medicine

## 2020-10-29 ENCOUNTER — Emergency Department (HOSPITAL_COMMUNITY): Payer: Medicare (Managed Care)

## 2020-10-29 DIAGNOSIS — R519 Headache, unspecified: Secondary | ICD-10-CM | POA: Diagnosis not present

## 2020-10-29 DIAGNOSIS — I10 Essential (primary) hypertension: Secondary | ICD-10-CM | POA: Diagnosis not present

## 2020-10-29 DIAGNOSIS — R21 Rash and other nonspecific skin eruption: Secondary | ICD-10-CM

## 2020-10-29 DIAGNOSIS — G8929 Other chronic pain: Secondary | ICD-10-CM | POA: Insufficient documentation

## 2020-10-29 DIAGNOSIS — Z79899 Other long term (current) drug therapy: Secondary | ICD-10-CM | POA: Diagnosis not present

## 2020-10-29 DIAGNOSIS — J45909 Unspecified asthma, uncomplicated: Secondary | ICD-10-CM | POA: Insufficient documentation

## 2020-10-29 MED ORDER — PREDNISONE 10 MG (21) PO TBPK: ORAL_TABLET | Freq: Every day | ORAL | 0 refills | Status: DC

## 2020-10-29 NOTE — Discharge Instructions (Addendum)
I am prescribing you a steroid medication called prednisone.  This is a tapered medication so you will take less and less each day.  Please only take it as prescribed.  Try to take it earlier in the day because this medication can be stimulating and make it difficult to sleep.  Please make sure you follow-up with your dermatologist and schedule a appointment as soon as possible for reevaluation.  You can also follow-up with your regular doctor.  If you develop any new or worsening symptoms, please come back to the emergency department.  It was a pleasure to meet you.
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
## 2020-10-29 NOTE — ED Triage Notes (Signed)
Patient here for evaluation of headache and bumps on head. Headache started on May 22 and has gotten worse. Patient alert, oriented, and in no apparent distress at this time.

## 2020-12-31 ENCOUNTER — Ambulatory Visit: Payer: Medicare (Managed Care) | Admitting: Adult Health
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
## 2021-06-30 ENCOUNTER — Encounter: Payer: Self-pay | Admitting: Adult Health

## 2021-06-30 ENCOUNTER — Ambulatory Visit (INDEPENDENT_AMBULATORY_CARE_PROVIDER_SITE_OTHER): Payer: Medicare (Managed Care) | Admitting: Adult Health

## 2021-06-30 ENCOUNTER — Other Ambulatory Visit: Payer: Self-pay

## 2021-06-30 VITALS — BP 149/85 | HR 75 | Ht 66.0 in | Wt 203.0 lb

## 2021-06-30 DIAGNOSIS — Z9989 Dependence on other enabling machines and devices: Secondary | ICD-10-CM | POA: Diagnosis not present

## 2021-06-30 DIAGNOSIS — G4733 Obstructive sleep apnea (adult) (pediatric): Secondary | ICD-10-CM | POA: Diagnosis not present

## 2021-06-30 NOTE — Patient Instructions (Signed)
Continue using CPAP nightly and greater than 4 hours each night °If your symptoms worsen or you develop new symptoms please let us know.  ° °

## 2021-09-01 ENCOUNTER — Telehealth: Payer: Self-pay | Admitting: Adult Health

## 2021-09-01 DIAGNOSIS — G4733 Obstructive sleep apnea (adult) (pediatric): Secondary | ICD-10-CM

## 2021-09-01 NOTE — Telephone Encounter (Signed)
Pt was seen by Jinny Blossom NP 06-30-21 and pt does not have a current order for cpap supplies. Order placed once electronically signed will send community message to [employer].  ?

## 2021-09-01 NOTE — Telephone Encounter (Signed)
Order placed for supplies.  Community message sent to [employer]. ?

## 2021-09-01 NOTE — Telephone Encounter (Signed)
Pt called stating that due to a rash that he had on his head that was being treated he was not able to be compliant with his Cpap. Pt states that it has been resolved and that when he went to order his cpap supplies they informed him that they have not received an updated Rx. Please advise. ?

## 2021-09-02 ENCOUNTER — Encounter (HOSPITAL_COMMUNITY): Payer: Self-pay | Admitting: *Deleted

## 2021-09-02 ENCOUNTER — Other Ambulatory Visit: Payer: Self-pay

## 2021-09-02 ENCOUNTER — Emergency Department (HOSPITAL_COMMUNITY)
Admission: EM | Admit: 2021-09-02 | Discharge: 2021-09-02 | Disposition: A | Discharge status: Home / Self Care | Payer: Medicare (Managed Care) | Attending: Emergency Medicine | Admitting: Emergency Medicine

## 2021-09-02 ENCOUNTER — Emergency Department (HOSPITAL_COMMUNITY): Payer: Medicare (Managed Care)

## 2021-09-02 DIAGNOSIS — M545 Low back pain, unspecified: Secondary | ICD-10-CM | POA: Diagnosis present

## 2021-09-02 MED ORDER — HYDROMORPHONE HCL 1 MG/ML IJ SOLN
1.0000 mg | Freq: Once | INTRAMUSCULAR | Status: DC
  Filled 2021-09-02: qty 1

## 2021-09-02 MED ORDER — METHOCARBAMOL 500 MG PO TABS: 500.0000 mg | ORAL_TABLET | Freq: Three times a day (TID) | ORAL | 0 refills | Status: DC | PRN

## 2021-09-02 MED ORDER — HYDROMORPHONE HCL 1 MG/ML IJ SOLN
1.0000 mg | Freq: Once | INTRAMUSCULAR | Status: AC
  Administered 2021-09-02: 1 mg via INTRAMUSCULAR

## 2021-09-02 MED ORDER — PREDNISONE 10 MG (21) PO TBPK: ORAL_TABLET | Freq: Every day | ORAL | 0 refills | Status: DC

## 2021-09-02 NOTE — Discharge Instructions (Signed)
Please follow-up with your surgeon.  Recommend taking the prescribed steroids.  Additionally can take the prescribed muscle relaxer as needed.  Additionally would take Tylenol and Motrin for pain control as needed.  If you develop numbness, weakness, bladder or bowel incontinence, fever, or other new concerning symptom, come back to ER for reassessment. ?

## 2021-09-02 NOTE — ED Provider Triage Note (Signed)
Emergency Medicine Provider Triage Evaluation Note ? ?Jeffery [name] , a 61 y.o. male  was evaluated in triage.  Pt complains of lower back pain. ? ?Review of Systems  ?Positive: Lower back pain, neck pain, "fluid in spine" ?Negative: Fever, bowel/bladder incontinence, rash ? ?Physical Exam  ?BP 129/87 (BP Location: Right Arm)   Pulse 79   Temp 98.1 ?F (36.7 ?C) (Oral)   Resp 16   SpO2 100%  ?Gen:   Awake, no distress   ?Resp:  Normal effort  ?MSK:   Moves extremities without difficulty  ?Other:   ? ?Medical Decision Making  ?Medically screening exam initiated at 11:49 AM.  Appropriate orders placed.  Jeffery [name] was informed that the remainder of the evaluation will be completed by another provider, this initial triage assessment does not replace that evaluation, and the importance of remaining in the ED until their evaluation is complete. ? ?Hx of lower back surgery.  Report having increase lower back pain with "fluid build up in my back".  Also report neck pain and R shoulder pain.  Sxs ongoing for months ?  ?[name], PA-C ?09/02/21 1154 ? ?

## 2021-09-02 NOTE — ED Triage Notes (Signed)
Pt reports hx of back surgery in past. Patient reports "fluid" building up lower back and has generalized joint/body pain. No distress noted at triage. ?

## 2021-09-02 NOTE — ED Notes (Signed)
Pt verbalized understanding of d/c instructions, meds, and followup care. Denies questions. VSS, no distress noted. Steady gait to exit with all belongings.  ?

## 2021-09-02 NOTE — ED Provider Notes (Signed)
?[facility] ?Provider Note ? ? ?CSN: [id number] ?Arrival date & time: 09/02/21  1056 ? ?  ? ?History ? ?Chief Complaint  ?Patient presents with  ? Back Pain  ? ? ?Jeffery [name] is a 61 y.o. male.  Presents to the emergency department with concern for back pain.  Patient states that he suffers from chronic back pain.  Had surgery with Dr. [name] in 2020 in his lumbar spine but has suffered from back pain ever since.  States that the pain today is not significantly different.  States that he feels like there is some moisture or fluid on his back or spine. ? ?He denies any bladder or bowel incontinence, no numbness or weakness in his legs.  Has not noted swelling in his spine.  No fevers.  No IVDU. ? ?Patient accompanied by caregiver. ? ?HPI ? ?  ? ?Home Medications ?Prior to Admission medications   ?Medication Sig Start Date End Date Taking? Authorizing Provider  ?albuterol (VENTOLIN HFA) 108 (90 Base) MCG/ACT inhaler Inhale 2 puffs into the lungs every 6 (six) hours as needed for wheezing or shortness of breath.    [provider]  ?cholecalciferol (VITAMIN D3) 25 MCG (1000 UT) tablet Take 2,000 Units by mouth daily.    [provider]  ?fluticasone (FLONASE) 50 MCG/ACT nasal spray Place 1 spray into both nostrils daily.    [provider]  ?gabapentin (NEURONTIN) 100 MG capsule Take 100 mg by mouth every 4 (four) hours as needed (pain).    [provider]  ?losartan-hydrochlorothiazide (HYZAAR) 100-12.5 MG tablet Take 1 tablet by mouth daily.    [provider]  ?methocarbamol (ROBAXIN) 500 MG tablet Take 1 tablet (500 mg total) by mouth every 8 (eight) hours as needed for muscle spasms. 09/02/21   Lucrezia Starch, MD  ?naproxen sodium (ALEVE) 220 MG tablet Take 440 mg by mouth daily as needed (pain).    [provider]  ?oxyCODONE-acetaminophen (PERCOCET) 10-325 MG tablet Take 1 tablet by mouth every 6 (six) hours as needed  for pain.    [provider]  ?Polyethylene Glycol 400 (BLINK TEARS OP) Place 1 drop into both eyes daily.    [provider]  ?predniSONE (STERAPRED UNI-PAK 21 TAB) 10 MG (21) TBPK tablet Take by mouth daily. Take 6 tabs by mouth daily  for 2 days, then 5 tabs for 2 days, then 4 tabs for 2 days, then 3 tabs for 2 days, 2 tabs for 2 days, then 1 tab by mouth daily for 2 days 10/29/20   Rayna Sexton, PA-C  ?predniSONE (STERAPRED UNI-PAK 21 TAB) 10 MG (21) TBPK tablet Take by mouth daily. Take 6 tabs by mouth daily  for 1 day, then 5 tabs for 1 day, then 4 tabs for 1 day, then 3 tabs for 1 day, 2 tabs for 1 day, then 1 tab by mouth daily for 1 day 09/02/21   Lucrezia Starch, MD  ?topiramate (TOPAMAX) 25 MG tablet Take 25 mg by mouth 2 (two) times daily.    [provider]  ?   ? ?Allergies    ?Patient has no known allergies.   ? ?Review of Systems   ?Review of Systems  ?Constitutional:  Negative for chills and fever.  ?HENT:  Negative for ear pain and sore throat.   ?Eyes:  Negative for pain and visual disturbance.  ?Respiratory:  Negative for cough and shortness of breath.   ?Cardiovascular:  Negative  for chest pain and palpitations.  ?Gastrointestinal:  Negative for abdominal pain and vomiting.  ?Genitourinary:  Negative for dysuria and hematuria.  ?Musculoskeletal:  Positive for back pain. Negative for arthralgias.  ?Skin:  Negative for color change and rash.  ?Neurological:  Negative for seizures and syncope.  ?All other systems reviewed and are negative. ? ?Physical Exam ?Updated Vital Signs ?BP 128/78   Pulse 74   Temp 98.1 ?F (36.7 ?C) (Oral)   Resp 18   SpO2 100%  ?Physical Exam ?Vitals and nursing note reviewed.  ?Constitutional:   ?   General: He is not in acute distress. ?   Appearance: He is well-developed.  ?HENT:  ?   Head: Normocephalic and atraumatic.  ?Eyes:  ?   Conjunctiva/sclera: Conjunctivae normal.  ?Cardiovascular:  ?   Rate and Rhythm: Normal rate and regular  rhythm.  ?   Heart sounds: No murmur heard. ?Pulmonary:  ?   Effort: Pulmonary effort is normal. No respiratory distress.  ?   Breath sounds: Normal breath sounds.  ?Abdominal:  ?   Palpations: Abdomen is soft.  ?   Tenderness: There is no abdominal tenderness.  ?Musculoskeletal:  ?   Cervical back: Neck supple.  ?   Comments: His back on visual inspection appears normal, there is no erythema or swelling visualized or palpated.  There is no deformity along his thoracic or lumbar spine.  There is no active drainage or open wounds.  ?Skin: ?   General: Skin is warm and dry.  ?   Capillary Refill: Capillary refill takes less than 2 seconds.  ?Neurological:  ?   Mental Status: He is alert.  ?   Comments: 5 out of 5 strength in upper and lower extremities, sensation to light touch intact in upper and lower extremities  ?Psychiatric:     ?   Mood and Affect: Mood normal.  ? ? ?ED Results / Procedures / Treatments   ?Labs ?(all labs ordered are listed, but only abnormal results are displayed) ?Labs Reviewed - No data to display ? ?EKG ?None ? ?Radiology ?DG Lumbar Spine Complete ? ?Result Date: 09/02/2021 ?CLINICAL DATA:  Back pain. EXAM: LUMBAR SPINE - COMPLETE 4+ VIEW COMPARISON:  None. FINDINGS: Normal alignment. Mild disc space narrowing at L5-S1. Otherwise, the disc spaces are maintained. Vertebral body heights are maintained. Degenerative endplate changes particularly at L4 and L5. No definite pars defect. Joint space narrowing and degenerative changes in the superior left hip joint. IMPRESSION: 1. No acute abnormality in lumbar spine. 2. 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 3. 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 Electronically Signed   By: Markus Daft M.D.   On: 09/02/2021 12:21   ? ?Procedures ?Procedures  ? ? ?Medications Ordered in ED ?Medications  ?HYDROmorphone (DILAUDID) injection 1 mg (1 mg Intramuscular Given 09/02/21 1919)  ? ? ?ED Course/ Medical Decision Making/ A&P ?  ?                         ?Medical Decision Making ?Risk ?Prescription drug management. ? ? ?61 year old gentleman presenting to ER with concern for acute on chronic back pain.  He described concern for sensation of fluid on his back.  On careful inspection, his lumbar spine appears normal.  There is no visual or palpated deformity's, no swelling, no erythema.  His back looks normal.  He has normal neurologic exam in his lower extremities.  No red flag  symptoms.  Plain films were independently reviewed by myself.  Some degenerative changes noted by radiologist and osteoarthritis hip noted.  Given chronicity of pain and patient's current exam I advised follow-up with his orthopedic spine specialist on an outpatient basis.  Recommend course of steroids.  Reviewed return precautions and discharged. ? ? ? ?After the discussed management above, the patient was determined to be safe for discharge.  The patient was in agreement with this plan and all questions regarding their care were answered.  ED return precautions were discussed and the patient will return to the ED with any significant worsening of condition. ? ? ? ? ? ? ? ? ?Final Clinical Impression(s) / ED Diagnoses ?Final diagnoses:  ?Low back pain, unspecified back pain laterality, unspecified chronicity, unspecified whether sciatica present  ? ? ?Rx / DC Orders ?ED Discharge Orders   ? ?      Ordered  ?  methocarbamol (ROBAXIN) 500 MG tablet  Every 8 hours PRN,   Status:  Discontinued       ? 09/02/21 1946  ?  predniSONE (STERAPRED UNI-PAK 21 TAB) 10 MG (21) TBPK tablet  Daily,   Status:  Discontinued       ? 09/02/21 1946  ?  methocarbamol (ROBAXIN) 500 MG tablet  Every 8 hours PRN       ? 09/02/21 1956  ?  predniSONE (STERAPRED UNI-PAK 21 TAB) 10 MG (21) TBPK tablet  Daily       ? 09/02/21 1956  ? ?  ?  ? ?  ? ? ?  ?Lucrezia Starch, MD ?09/03/21 1542 ? ?

## 2021-09-06 NOTE — Telephone Encounter (Signed)
Towanda Octave, RN; Bearl Mulberry ?Got it - added Marysa...  ? ?Christina   ? ?  ?Previous Messages ?  ?----- Message -----  ?From: Brandon Melnick, RN  ?Sent: 09/01/2021   3:46 PM EDT  ?To: Ocie Bob  ?Subject: new supplies                                  ? ?New order for pt is in Epic.  ? ?Desmond Dike  ?Male, 61 y.o., 1961/04/24  ?MRN:  ?[id number]  ?Last seen 06-30-2021  ? ?Sandy RN  ? ?

## 2021-09-22 NOTE — Telephone Encounter (Signed)
Pt is calling to report that even though he received a message from either Dr Rexene Alberts or one of her RN's stating he was ok to order CPAP supplies he is now being told by DME that he is out of compliance and can not order supplies, please call. ?

## 2021-09-22 NOTE — Telephone Encounter (Signed)
Heard back from [place] w/ [facility]. They were able to get the supplies approved by insurance. Pt needs to make sure he uses the machine at least 4 hours each night. Caryl Pina said she called the pt & LVM. She needs to discuss his mask and then they will send it out through [employer]upp[employer].  ?

## 2021-09-22 NOTE — Telephone Encounter (Signed)
Message sent to [facility]. Waiting on response.  ?

## 2022-02-03 ENCOUNTER — Ambulatory Visit: Payer: Self-pay | Admitting: Student

## 2022-02-18 NOTE — Patient Instructions (Addendum)
SURGICAL WAITING ROOM VISITATION Patients having surgery or a procedure may have no more than 2 support people in the waiting area - these visitors may rotate.   Children under the age of 12 must have an adult with them who is not the patient. If the patient needs to stay at the hospital during part of their recovery, the visitor guidelines for inpatient rooms apply. Pre-op nurse will coordinate an appropriate time for 1 support person to accompany patient in pre-op.  This support person may not rotate.    Please refer to the [facility] website for the visitor guidelines for Inpatients (after your surgery is over and you are in a regular room).      Your procedure is scheduled on: 03-09-22   Report to [facility] Main Entrance    Report to admitting at 6:15 AM   Call this number if you have problems the morning of surgery [phone]   Do not eat food :After Midnight.   After Midnight you may have the following liquids until 5:30 AM DAY OF SURGERY  Water Non-Citrus Juices (without pulp, NO RED) Carbonated Beverages Black Coffee (NO MILK/CREAM OR CREAMERS, sugar ok)  Clear Tea (NO MILK/CREAM OR CREAMERS, sugar ok) regular and decaf                             Plain Jell-O (NO RED)                                           Fruit ices (not with fruit pulp, NO RED)                                     Popsicles (NO RED)                                                               Sports drinks like Gatorade (NO RED)                 The day of surgery:  Drink ONE (1) Pre-Surgery G2 at 5:30 AM the morning of surgery. Drink in one sitting. Do not sip.  This drink was given to you during your hospital  pre-op appointment visit. Nothing else to drink after completing the Pre-Surgery G2.          If you have questions, please contact your surgeon's office.   FOLLOW ANY ADDITIONAL PRE OP INSTRUCTIONS YOU RECEIVED FROM YOUR SURGEON'S OFFICE!!!     Oral Hygiene is also  important to reduce your risk of infection.                                    Remember - BRUSH YOUR TEETH THE MORNING OF SURGERY WITH YOUR REGULAR TOOTHPASTE   Do NOT smoke after Midnight   Take these medicines the morning of surgery with A SIP OF WATER:   Gabapentin  Omeprazole  Oxycodone if needed  Okay to use Albuterol inhaler  DO  NOT TAKE ANY ORAL DIABETIC MEDICATIONS DAY OF YOUR SURGERY  Bring CPAP mask and tubing day of surgery.                              You may not have any metal on your body including  jewelry, and body piercing             Do not wear  lotions, powders, cologne, or deodorant              Men may shave face and neck.   Do not bring valuables to the hospital. [place].   Contacts, dentures or bridgework may not be worn into surgery.   Bring small overnight bag day of surgery.   DO NOT [place]. PHARMACY WILL DISPENSE MEDICATIONS LISTED ON YOUR MEDICATION LIST TO YOU DURING YOUR ADMISSION [place]!   Special Instructions: Bring a copy of your healthcare power of attorney and living will documents the day of surgery if you haven't scanned them before.   Please read over the following fact sheets you were given: IF [place]  If you received a COVID test during your pre-op visit  it is requested that you wear a mask when out in public, stay away from anyone that may not be feeling well and notify your surgeon if you develop symptoms. If you test positive for Covid or have been in contact with anyone that has tested positive in the last 10 days please notify you surgeon.  [place] - Preparing for Surgery Before surgery, you can play an important role.  Because skin is not sterile, your skin needs to be as free of germs as possible.  You can reduce the number of germs on your skin by washing with CHG  (chlorahexidine gluconate) soap before surgery.  CHG is an antiseptic cleaner which kills germs and bonds with the skin to continue killing germs even after washing. Please DO NOT use if you have an allergy to CHG or antibacterial soaps.  If your skin becomes reddened/irritated stop using the CHG and inform your nurse when you arrive at Short Stay. Do not shave (including legs and underarms) for at least 48 hours prior to the first CHG shower.  You may shave your face/neck.  Please follow these instructions carefully:  1.  Shower with CHG Soap the night before surgery and the  morning of surgery.  2.  If you choose to wash your hair, wash your hair first as usual with your normal  shampoo.  3.  After you shampoo, rinse your hair and body thoroughly to remove the shampoo.                             4.  Use CHG as you would any other liquid soap.  You can apply chg directly to the skin and wash.  Gently with a scrungie or clean washcloth.  5.  Apply the CHG Soap to your body ONLY FROM THE NECK DOWN.   Do   not use on face/ open                           Wound or open sores. Avoid contact with eyes, ears mouth  and   genitals (private parts).                       Wash face,  Genitals (private parts) with your normal soap.             6.  Wash thoroughly, paying special attention to the area where your    surgery  will be performed.  7.  Thoroughly rinse your body with warm water from the neck down.  8.  DO NOT shower/wash with your normal soap after using and rinsing off the CHG Soap.                9.  Pat yourself dry with a clean towel.            10.  Wear clean pajamas.            11.  Place clean sheets on your bed the night of your first shower and do not  sleep with pets. Day of Surgery : Do not apply any lotions/deodorants the morning of surgery.  Please wear clean clothes to the hospital/surgery center.  FAILURE TO FOLLOW THESE INSTRUCTIONS MAY RESULT IN THE CANCELLATION OF YOUR  SURGERY  PATIENT SIGNATURE_________________________________  NURSE SIGNATURE__________________________________  ________________________________________________________________________     Adam Phenix  An incentive spirometer is a tool that can help keep your lungs clear and active. This tool measures how well you are filling your lungs with each breath. Taking long deep breaths may help reverse or decrease the chance of developing breathing (pulmonary) problems (especially infection) following: A long period of time when you are unable to move or be active. BEFORE THE PROCEDURE  If the spirometer includes an indicator to show your best effort, your nurse or respiratory therapist will set it to a desired goal. If possible, sit up straight or lean slightly forward. Try not to slouch. Hold the incentive spirometer in an upright position. INSTRUCTIONS FOR USE  Sit on the edge of your bed if possible, or sit up as far as you can in bed or on a chair. Hold the incentive spirometer in an upright position. Breathe out normally. Place the mouthpiece in your mouth and seal your lips tightly around it. Breathe in slowly and as deeply as possible, raising the piston or the ball toward the top of the column. Hold your breath for 3-5 seconds or for as long as possible. Allow the piston or ball to fall to the bottom of the column. Remove the mouthpiece from your mouth and breathe out normally. Rest for a few seconds and repeat Steps 1 through 7 at least 10 times every 1-2 hours when you are awake. Take your time and take a few normal breaths between deep breaths. The spirometer may include an indicator to show your best effort. Use the indicator as a goal to work toward during each repetition. After each set of 10 deep breaths, practice coughing to be sure your lungs are clear. If you have an incision (the cut made at the time of surgery), support your incision when coughing by placing a pillow  or rolled up towels firmly against it. Once you are able to get out of bed, walk around indoors and cough well. You may stop using the incentive spirometer when instructed by your caregiver.  RISKS AND COMPLICATIONS Take your time so you do not get dizzy or light-headed. If you are in pain, you may need to take or ask for pain  medication before doing incentive spirometry. It is harder to take a deep breath if you are having pain. AFTER USE Rest and breathe slowly and easily. It can be helpful to keep track of a log of your progress. Your caregiver can provide you with a simple table to help with this. If you are using the spirometer at home, follow these instructions: [place] IF:  You are having difficultly using the spirometer. You have trouble using the spirometer as often as instructed. Your pain medication is not giving enough relief while using the spirometer. You develop fever of 100.5 F (38.1 C) or higher. SEEK IMMEDIATE MEDICAL CARE IF:  You cough up bloody sputum that had not been present before. You develop fever of 102 F (38.9 C) or greater. You develop worsening pain at or near the incision site. MAKE SURE YOU:  Understand these instructions. Will watch your condition. Will get help right away if you are not doing well or get worse. Document Released: 09/19/2006 Document Revised: 08/01/2011 Document Reviewed: 11/20/2006 [employer] Patient Information 2014 [employer], [place].   ________________________________________________________________________    WHAT IS A BLOOD TRANSFUSION? Blood Transfusion Information  A transfusion is the replacement of blood or some of its parts. Blood is made up of multiple cells which provide different functions. Red blood cells carry oxygen and are used for blood loss replacement. White blood cells fight against infection. Platelets control bleeding. Plasma helps clot blood. Other blood products are available for specialized  needs, such as hemophilia or other clotting disorders. BEFORE THE TRANSFUSION  Who gives blood for transfusions?  Healthy volunteers who are fully evaluated to make sure their blood is safe. This is blood bank blood. Transfusion therapy is the safest it has ever been in the practice of medicine. Before blood is taken from a donor, a complete history is taken to make sure that person has no history of diseases nor engages in risky social behavior (examples are intravenous drug use or sexual activity with multiple partners). The donor's travel history is screened to minimize risk of transmitting infections, such as malaria. The donated blood is tested for signs of infectious diseases, such as HIV and hepatitis. The blood is then tested to be sure it is compatible with you in order to minimize the chance of a transfusion reaction. If you or a relative donates blood, this is often done in anticipation of surgery and is not appropriate for emergency situations. It takes many days to process the donated blood. RISKS AND COMPLICATIONS Although transfusion therapy is very safe and saves many lives, the main dangers of transfusion include:  Getting an infectious disease. Developing a transfusion reaction. This is an allergic reaction to something in the blood you were given. Every precaution is taken to prevent this. The decision to have a blood transfusion has been considered carefully by your caregiver before blood is given. Blood is not given unless the benefits outweigh the risks. AFTER THE TRANSFUSION Right after receiving a blood transfusion, you will usually feel much better and more energetic. This is especially true if your red blood cells have gotten low (anemic). The transfusion raises the level of the red blood cells which carry oxygen, and this usually causes an energy increase. The nurse administering the transfusion will monitor you carefully for complications. HOME CARE INSTRUCTIONS  No special  instructions are needed after a transfusion. You may find your energy is better. Speak with your caregiver about any limitations on activity for underlying diseases you may  have. SEEK MEDICAL CARE IF:  Your condition is not improving after your transfusion. You develop redness or irritation at the intravenous (IV) site. SEEK IMMEDIATE MEDICAL CARE IF:  Any of the following symptoms occur over the next 12 hours: Shaking chills. You have a temperature by mouth above 102 F (38.9 C), not controlled by medicine. Chest, back, or muscle pain. People around you feel you are not acting correctly or are confused. Shortness of breath or difficulty breathing. Dizziness and fainting. You get a rash or develop hives. You have a decrease in urine output. Your urine turns a dark color or changes to pink, red, or brown. Any of the following symptoms occur over the next 10 days: You have a temperature by mouth above 102 F (38.9 C), not controlled by medicine. Shortness of breath. Weakness after normal activity. The white part of the eye turns yellow (jaundice). You have a decrease in the amount of urine or are urinating less often. Your urine turns a dark color or changes to pink, red, or brown. Document Released: 05/06/2000 Document Revised: 08/01/2011 Document Reviewed: 12/24/2007 [facility] Patient Information 2014 [place], [place].  _______________________________________________________________________

## 2022-02-28 ENCOUNTER — Ambulatory Visit: Payer: Self-pay | Admitting: Student
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
## 2022-03-01 ENCOUNTER — Encounter (HOSPITAL_COMMUNITY): Payer: Self-pay

## 2022-03-01 NOTE — Progress Notes (Addendum)
COVID Vaccine Completed:  Yes  Date of COVID positive in last 90 days:  PCP - Kellie Shropshire, MD Cardiologist -   Medical clearance on chart dated 01-26-22 by Dr. Kristie Cowman  Chest x-ray -  EKG -  Stress Test -  ECHO -  Cardiac Cath -  Pacemaker/ICD device last checked: Spinal Cord Stimulator:  Bowel Prep -   Sleep Study - Yes,   Fasting Blood Sugar -  Checks Blood Sugar _____ times a day  Blood Thinner Instructions: Aspirin Instructions: Last Dose:  Activity level:  Can go up a flight of stairs and perform activities of daily living without stopping and without symptoms of chest pain or shortness of breath.  Able to exercise without symptoms  Unable to go up a flight of stairs without symptoms of     Anesthesia review:   Patient denies shortness of breath, fever, cough and chest pain at PAT appointment  Patient verbalized understanding of instructions that were given to them at the PAT appointment. Patient was also instructed that they will need to review over the PAT instructions again at home before surgery.

## 2022-03-02 ENCOUNTER — Other Ambulatory Visit: Payer: Self-pay

## 2022-03-02 ENCOUNTER — Encounter (HOSPITAL_COMMUNITY)
Admission: RE | Admit: 2022-03-02 | Discharge: 2022-03-02 | Disposition: A | Discharge status: Home / Self Care | Payer: Medicare Other | Source: Ambulatory Visit | Attending: Orthopedic Surgery | Admitting: Orthopedic Surgery

## 2022-03-02 ENCOUNTER — Encounter (HOSPITAL_COMMUNITY): Payer: Self-pay

## 2022-03-02 VITALS — BP 135/93 | HR 67 | Temp 98.1°F | Resp 12 | Ht 66.0 in | Wt 187.2 lb

## 2022-03-02 DIAGNOSIS — I2510 Atherosclerotic heart disease of native coronary artery without angina pectoris: Secondary | ICD-10-CM | POA: Diagnosis not present

## 2022-03-02 DIAGNOSIS — R7303 Prediabetes: Secondary | ICD-10-CM | POA: Insufficient documentation

## 2022-03-02 DIAGNOSIS — Z01818 Encounter for other preprocedural examination: Secondary | ICD-10-CM | POA: Insufficient documentation

## 2022-03-02 HISTORY — DX: Prediabetes: R73.03

## 2022-03-02 HISTORY — DX: Dyspnea, unspecified: R06.00

## 2022-03-02 HISTORY — DX: Unspecified osteoarthritis, unspecified site: M19.90

## 2022-03-02 HISTORY — DX: Gastro-esophageal reflux disease without esophagitis: K21.9

## 2022-03-02 HISTORY — DX: Headache, unspecified: R51.9

## 2022-03-02 HISTORY — DX: Depression, unspecified: F32.A

## 2022-03-02 LAB — GLUCOSE, CAPILLARY: Glucose-Capillary: 105 mg/dL — ABNORMAL HIGH (ref 70–99)

## 2022-03-02 LAB — CBC
HCT: 47.9 % (ref 39.0–52.0)
Hemoglobin: 16.2 g/dL (ref 13.0–17.0)
MCH: 32.3 pg (ref 26.0–34.0)
MCHC: 33.8 g/dL (ref 30.0–36.0)
MCV: 95.6 fL (ref 80.0–100.0)
Platelets: 214 10*3/uL (ref 150–400)
RBC: 5.01 MIL/uL (ref 4.22–5.81)
RDW: 12.1 % (ref 11.5–15.5)
WBC: 7.7 10*3/uL (ref 4.0–10.5)
nRBC: 0 % (ref 0.0–0.2)

## 2022-03-02 LAB — BASIC METABOLIC PANEL
Anion gap: 6 (ref 5–15)
BUN: 21 mg/dL — ABNORMAL HIGH (ref 6–20)
CO2: 29 mmol/L (ref 22–32)
Calcium: 9.8 mg/dL (ref 8.9–10.3)
Chloride: 106 mmol/L (ref 98–111)
Creatinine, Ser: 1.26 mg/dL — ABNORMAL HIGH (ref 0.61–1.24)
GFR, Estimated: >60 mL/min (ref >60)
Glucose, Bld: 87 mg/dL (ref 70–99)
Potassium: 3.5 mmol/L (ref 3.5–5.1)
Sodium: 141 mmol/L (ref 135–145)

## 2022-03-02 LAB — SURGICAL PCR SCREEN
MRSA, PCR: NEGATIVE
Staphylococcus aureus: NEGATIVE

## 2022-03-02 LAB — HEMOGLOBIN A1C
Hgb A1c MFr Bld: 5.7 % — ABNORMAL HIGH (ref 4.8–5.6)
Mean Plasma Glucose: 116.89 mg/dL

## 2022-03-08 NOTE — Anesthesia Preprocedure Evaluation (Addendum)
Anesthesia Evaluation  Patient identified by MRN, date of birth, ID band Patient awake    Reviewed: Allergy & Precautions, NPO status , Patient's Chart, lab work & pertinent test results  Airway Mallampati: II  TM Distance: >3 FB Neck ROM: Full    Dental no notable dental hx.    Pulmonary asthma ,    Pulmonary exam normal        Cardiovascular hypertension, Pt. on medications  Rhythm:Regular Rate:Normal     Neuro/Psych  Headaches, Depression    GI/Hepatic Neg liver ROS, GERD  Medicated,  Endo/Other  negative endocrine ROS  Renal/GU negative Renal ROS  negative genitourinary   Musculoskeletal  (+) Arthritis , Osteoarthritis,    Abdominal Normal abdominal exam  (+)   Peds  Hematology negative hematology ROS (+)   Anesthesia Other Findings   Reproductive/Obstetrics                            Anesthesia Physical Anesthesia Plan  ASA: 2  Anesthesia Plan: MAC and Spinal   Post-op Pain Management:    Induction: Intravenous  PONV Risk Score and Plan: 1 and Ondansetron, Dexamethasone, Propofol infusion, Midazolam and Treatment may vary due to age or medical condition  Airway Management Planned: Simple Face Mask, Natural Airway and Nasal Cannula  Additional Equipment: None  Intra-op Plan:   Post-operative Plan:   Informed Consent: I have reviewed the patients History and Physical, chart, labs and discussed the procedure including the risks, benefits and alternatives for the proposed anesthesia with the patient or authorized representative who has indicated his/her understanding and acceptance.     Dental advisory given  Plan Discussed with: CRNA  Anesthesia Plan Comments: (Lab Results      Component                Value               Date                      WBC                      7.7                 03/02/2022                HGB                      16.2                 03/02/2022                HCT                      47.9                03/02/2022                MCV                      95.6                03/02/2022                PLT                      214  03/02/2022           Lab Results      Component                Value               Date                      NA                       141                 03/02/2022                K                        3.5                 03/02/2022                CO2                      29                  03/02/2022                GLUCOSE                  87                  03/02/2022                BUN                      21 (H)              03/02/2022                CREATININE               1.26 (H)            03/02/2022                CALCIUM                  9.8                 03/02/2022                GFRNONAA                 >60                 03/02/2022          )       Anesthesia Quick Evaluation

## 2022-03-09 ENCOUNTER — Ambulatory Visit (HOSPITAL_COMMUNITY): Payer: Medicare Other

## 2022-03-09 ENCOUNTER — Encounter (HOSPITAL_COMMUNITY)
Admission: RE | Disposition: A | Discharge status: Home / Self Care | Payer: Self-pay | Source: Home / Self Care | Attending: Orthopedic Surgery

## 2022-03-09 ENCOUNTER — Ambulatory Visit (HOSPITAL_BASED_OUTPATIENT_CLINIC_OR_DEPARTMENT_OTHER): Payer: Medicare Other | Admitting: Anesthesiology

## 2022-03-09 ENCOUNTER — Encounter (HOSPITAL_COMMUNITY): Payer: Self-pay | Admitting: Orthopedic Surgery

## 2022-03-09 ENCOUNTER — Other Ambulatory Visit: Payer: Self-pay

## 2022-03-09 ENCOUNTER — Ambulatory Visit (HOSPITAL_COMMUNITY): Payer: Medicare Other | Admitting: Anesthesiology

## 2022-03-09 ENCOUNTER — Ambulatory Visit (HOSPITAL_COMMUNITY)
Admission: RE | Admit: 2022-03-09 | Discharge: 2022-03-10 | Disposition: A | Discharge status: Home / Self Care | Payer: Medicare Other | Attending: Orthopedic Surgery | Admitting: Orthopedic Surgery

## 2022-03-09 DIAGNOSIS — Z79899 Other long term (current) drug therapy: Secondary | ICD-10-CM | POA: Insufficient documentation

## 2022-03-09 DIAGNOSIS — M1612 Unilateral primary osteoarthritis, left hip: Secondary | ICD-10-CM | POA: Diagnosis present

## 2022-03-09 DIAGNOSIS — I10 Essential (primary) hypertension: Secondary | ICD-10-CM | POA: Insufficient documentation

## 2022-03-09 DIAGNOSIS — J45909 Unspecified asthma, uncomplicated: Secondary | ICD-10-CM | POA: Insufficient documentation

## 2022-03-09 DIAGNOSIS — R7303 Prediabetes: Secondary | ICD-10-CM

## 2022-03-09 DIAGNOSIS — K219 Gastro-esophageal reflux disease without esophagitis: Secondary | ICD-10-CM | POA: Diagnosis not present

## 2022-03-09 DIAGNOSIS — Z96642 Presence of left artificial hip joint: Secondary | ICD-10-CM | POA: Diagnosis present

## 2022-03-09 HISTORY — PX: TOTAL HIP ARTHROPLASTY: SHX124

## 2022-03-09 LAB — TYPE AND SCREEN
ABO/RH(D): A POS
Antibody Screen: NEGATIVE

## 2022-03-09 LAB — ABO/RH: ABO/RH(D): A POS

## 2022-03-09 SURGERY — ARTHROPLASTY, HIP, TOTAL, ANTERIOR APPROACH
Anesthesia: Monitor Anesthesia Care | Site: Hip | Laterality: Left

## 2022-03-09 MED ORDER — KETOROLAC TROMETHAMINE 30 MG/ML IJ SOLN
INTRAMUSCULAR | Status: AC
  Filled 2022-03-09: qty 1

## 2022-03-09 MED ORDER — ISOPROPYL ALCOHOL 70 % SOLN
Status: DC | PRN
  Administered 2022-03-09: 1 via TOPICAL

## 2022-03-09 MED ORDER — BUPIVACAINE-EPINEPHRINE (PF) 0.25% -1:200000 IJ SOLN
INTRAMUSCULAR | Status: AC
  Filled 2022-03-09: qty 30

## 2022-03-09 MED ORDER — ASPIRIN 81 MG PO CHEW
81.0000 mg | CHEWABLE_TABLET | Freq: Two times a day (BID) | ORAL | Status: DC
  Administered 2022-03-09 – 2022-03-10 (×2): 81 mg via ORAL
  Filled 2022-03-09 (×2): qty 1

## 2022-03-09 MED ORDER — PROPOFOL 500 MG/50ML IV EMUL
INTRAVENOUS | Status: DC | PRN
  Administered 2022-03-09: 125 ug/kg/min via INTRAVENOUS

## 2022-03-09 MED ORDER — PROPOFOL 10 MG/ML IV BOLUS
INTRAVENOUS | Status: AC
  Filled 2022-03-09: qty 20

## 2022-03-09 MED ORDER — LIDOCAINE HCL (PF) 2 % IJ SOLN
INTRAMUSCULAR | Status: AC
  Filled 2022-03-09: qty 10

## 2022-03-09 MED ORDER — METOCLOPRAMIDE HCL 5 MG/ML IJ SOLN: 5.0000 mg | Freq: Three times a day (TID) | INTRAMUSCULAR | Status: DC | PRN

## 2022-03-09 MED ORDER — ALUM & MAG HYDROXIDE-SIMETH 200-200-20 MG/5ML PO SUSP: 30.0000 mL | ORAL | Status: DC | PRN

## 2022-03-09 MED ORDER — BUPIVACAINE-EPINEPHRINE 0.25% -1:200000 IJ SOLN
INTRAMUSCULAR | Status: DC | PRN
  Administered 2022-03-09: 30 mL

## 2022-03-09 MED ORDER — PROPOFOL 10 MG/ML IV BOLUS
INTRAVENOUS | Status: DC | PRN
  Administered 2022-03-09 (×2): 20 mg via INTRAVENOUS
  Administered 2022-03-09: 30 mg via INTRAVENOUS

## 2022-03-09 MED ORDER — ALBUMIN HUMAN 5 % IV SOLN: INTRAVENOUS | Status: DC | PRN

## 2022-03-09 MED ORDER — METHOCARBAMOL 500 MG IVPB - SIMPLE MED: 500.0000 mg | Freq: Four times a day (QID) | INTRAVENOUS | Status: DC | PRN

## 2022-03-09 MED ORDER — HYDROCODONE-ACETAMINOPHEN 7.5-325 MG PO TABS: 1.0000 | ORAL_TABLET | ORAL | Status: DC | PRN

## 2022-03-09 MED ORDER — WATER FOR IRRIGATION, STERILE IR SOLN
Status: DC | PRN
  Administered 2022-03-09: 2000 mL

## 2022-03-09 MED ORDER — DEXMEDETOMIDINE HCL IN NACL 80 MCG/20ML IV SOLN
INTRAVENOUS | Status: AC
  Filled 2022-03-09: qty 20

## 2022-03-09 MED ORDER — SODIUM CHLORIDE 0.9 % IR SOLN
Status: DC | PRN
  Administered 2022-03-09: 4000 mL

## 2022-03-09 MED ORDER — ALBUTEROL SULFATE HFA 108 (90 BASE) MCG/ACT IN AERS: 2.0000 | INHALATION_SPRAY | Freq: Four times a day (QID) | RESPIRATORY_TRACT | Status: DC | PRN

## 2022-03-09 MED ORDER — FENTANYL CITRATE PF 50 MCG/ML IJ SOSY: 25.0000 ug | PREFILLED_SYRINGE | INTRAMUSCULAR | Status: DC | PRN

## 2022-03-09 MED ORDER — DEXAMETHASONE SODIUM PHOSPHATE 10 MG/ML IJ SOLN
INTRAMUSCULAR | Status: AC
  Filled 2022-03-09: qty 2

## 2022-03-09 MED ORDER — ACETAMINOPHEN 325 MG PO TABS: 325.0000 mg | ORAL_TABLET | Freq: Four times a day (QID) | ORAL | Status: DC | PRN

## 2022-03-09 MED ORDER — POVIDONE-IODINE 10 % EX SWAB: 2.0000 | Freq: Once | CUTANEOUS | Status: DC

## 2022-03-09 MED ORDER — ONDANSETRON HCL 4 MG PO TABS: 4.0000 mg | ORAL_TABLET | Freq: Four times a day (QID) | ORAL | Status: DC | PRN

## 2022-03-09 MED ORDER — PHENOL 1.4 % MT LIQD: 1.0000 | OROMUCOSAL | Status: DC | PRN

## 2022-03-09 MED ORDER — DIPHENHYDRAMINE HCL 12.5 MG/5ML PO ELIX: 12.5000 mg | ORAL_SOLUTION | ORAL | Status: DC | PRN

## 2022-03-09 MED ORDER — ONDANSETRON HCL 4 MG/2ML IJ SOLN
INTRAMUSCULAR | Status: AC
  Filled 2022-03-09: qty 4

## 2022-03-09 MED ORDER — PANTOPRAZOLE SODIUM 40 MG PO TBEC
40.0000 mg | DELAYED_RELEASE_TABLET | Freq: Every day | ORAL | Status: DC
  Administered 2022-03-09 – 2022-03-10 (×2): 40 mg via ORAL
  Filled 2022-03-09 (×2): qty 1

## 2022-03-09 MED ORDER — MIDAZOLAM HCL 2 MG/2ML IJ SOLN
INTRAMUSCULAR | Status: AC
  Filled 2022-03-09: qty 2

## 2022-03-09 MED ORDER — METHOCARBAMOL 500 MG PO TABS
500.0000 mg | ORAL_TABLET | Freq: Four times a day (QID) | ORAL | Status: DC | PRN
  Administered 2022-03-09 – 2022-03-10 (×2): 500 mg via ORAL
  Filled 2022-03-09 (×2): qty 1

## 2022-03-09 MED ORDER — DOCUSATE SODIUM 100 MG PO CAPS
100.0000 mg | ORAL_CAPSULE | Freq: Two times a day (BID) | ORAL | Status: DC
  Administered 2022-03-09 – 2022-03-10 (×2): 100 mg via ORAL
  Filled 2022-03-09 (×2): qty 1

## 2022-03-09 MED ORDER — BISACODYL 10 MG RE SUPP: 10.0000 mg | Freq: Every day | RECTAL | Status: DC | PRN

## 2022-03-09 MED ORDER — SENNA 8.6 MG PO TABS
1.0000 | ORAL_TABLET | Freq: Two times a day (BID) | ORAL | Status: DC
  Administered 2022-03-09 – 2022-03-10 (×2): 8.6 mg via ORAL
  Filled 2022-03-09 (×2): qty 1

## 2022-03-09 MED ORDER — DEXMEDETOMIDINE HCL IN NACL 80 MCG/20ML IV SOLN
INTRAVENOUS | Status: DC | PRN
  Administered 2022-03-09: 4 ug via BUCCAL
  Administered 2022-03-09: 12 ug via BUCCAL
  Administered 2022-03-09: 4 ug via BUCCAL

## 2022-03-09 MED ORDER — EPHEDRINE SULFATE-NACL 50-0.9 MG/10ML-% IV SOSY
PREFILLED_SYRINGE | INTRAVENOUS | Status: DC | PRN
  Administered 2022-03-09: 10 mg via INTRAVENOUS

## 2022-03-09 MED ORDER — ORAL CARE MOUTH RINSE: 15.0000 mL | Freq: Once | OROMUCOSAL | Status: AC

## 2022-03-09 MED ORDER — CEFAZOLIN SODIUM-DEXTROSE 2-4 GM/100ML-% IV SOLN
2.0000 g | Freq: Four times a day (QID) | INTRAVENOUS | Status: AC
  Administered 2022-03-09 (×2): 2 g via INTRAVENOUS
  Filled 2022-03-09 (×2): qty 100

## 2022-03-09 MED ORDER — OXYCODONE HCL 5 MG PO TABS: 5.0000 mg | ORAL_TABLET | ORAL | Status: DC | PRN

## 2022-03-09 MED ORDER — ALBUMIN HUMAN 5 % IV SOLN
INTRAVENOUS | Status: AC
  Filled 2022-03-09: qty 250

## 2022-03-09 MED ORDER — SODIUM CHLORIDE (PF) 0.9 % IJ SOLN
INTRAMUSCULAR | Status: DC | PRN
  Administered 2022-03-09: 30 mL

## 2022-03-09 MED ORDER — BUPIVACAINE IN DEXTROSE 0.75-8.25 % IT SOLN
INTRATHECAL | Status: DC | PRN
  Administered 2022-03-09: 1.8 mL via INTRATHECAL

## 2022-03-09 MED ORDER — SODIUM CHLORIDE (PF) 0.9 % IJ SOLN
INTRAMUSCULAR | Status: AC
  Filled 2022-03-09: qty 30

## 2022-03-09 MED ORDER — LACTATED RINGERS IV SOLN: INTRAVENOUS | Status: DC

## 2022-03-09 MED ORDER — FENTANYL CITRATE (PF) 100 MCG/2ML IJ SOLN
INTRAMUSCULAR | Status: AC
  Filled 2022-03-09: qty 2

## 2022-03-09 MED ORDER — ACETAMINOPHEN 500 MG PO TABS
500.0000 mg | ORAL_TABLET | Freq: Four times a day (QID) | ORAL | Status: DC
  Administered 2022-03-09: 500 mg via ORAL
  Filled 2022-03-09 (×4): qty 1

## 2022-03-09 MED ORDER — ALBUTEROL SULFATE 1.25 MG/3ML IN NEBU: 1.2500 mg | INHALATION_SOLUTION | Freq: Four times a day (QID) | RESPIRATORY_TRACT | Status: DC | PRN

## 2022-03-09 MED ORDER — MENTHOL 3 MG MT LOZG: 1.0000 | LOZENGE | OROMUCOSAL | Status: DC | PRN

## 2022-03-09 MED ORDER — LIDOCAINE 2% (20 MG/ML) 5 ML SYRINGE
INTRAMUSCULAR | Status: DC | PRN
  Administered 2022-03-09: 20 mg via INTRAVENOUS

## 2022-03-09 MED ORDER — METOCLOPRAMIDE HCL 5 MG PO TABS: 5.0000 mg | ORAL_TABLET | Freq: Three times a day (TID) | ORAL | Status: DC | PRN

## 2022-03-09 MED ORDER — CEFAZOLIN SODIUM-DEXTROSE 2-4 GM/100ML-% IV SOLN
2.0000 g | INTRAVENOUS | Status: AC
  Administered 2022-03-09: 2 g via INTRAVENOUS
  Filled 2022-03-09: qty 100

## 2022-03-09 MED ORDER — ACETAMINOPHEN 500 MG PO TABS
1000.0000 mg | ORAL_TABLET | Freq: Once | ORAL | Status: AC
  Administered 2022-03-09: 1000 mg via ORAL
  Filled 2022-03-09: qty 2

## 2022-03-09 MED ORDER — TRANEXAMIC ACID-NACL 1000-0.7 MG/100ML-% IV SOLN
1000.0000 mg | INTRAVENOUS | Status: AC
  Administered 2022-03-09: 1000 mg via INTRAVENOUS
  Filled 2022-03-09: qty 100

## 2022-03-09 MED ORDER — POVIDONE-IODINE 10 % EX SWAB
2.0000 | Freq: Once | CUTANEOUS | Status: AC
  Administered 2022-03-09: 2 via TOPICAL

## 2022-03-09 MED ORDER — ALBUTEROL SULFATE (2.5 MG/3ML) 0.083% IN NEBU: 2.5000 mg | INHALATION_SOLUTION | Freq: Four times a day (QID) | RESPIRATORY_TRACT | Status: DC | PRN

## 2022-03-09 MED ORDER — PROPOFOL 1000 MG/100ML IV EMUL
INTRAVENOUS | Status: AC
  Filled 2022-03-09: qty 100

## 2022-03-09 MED ORDER — OXYCODONE HCL 5 MG PO TABS
10.0000 mg | ORAL_TABLET | ORAL | Status: DC | PRN
  Administered 2022-03-09 – 2022-03-10 (×2): 15 mg via ORAL
  Filled 2022-03-09 (×2): qty 3

## 2022-03-09 MED ORDER — SODIUM CHLORIDE 0.9 % IV SOLN: INTRAVENOUS | Status: DC

## 2022-03-09 MED ORDER — CHLORHEXIDINE GLUCONATE 0.12 % MT SOLN
15.0000 mL | Freq: Once | OROMUCOSAL | Status: AC
  Administered 2022-03-09: 15 mL via OROMUCOSAL

## 2022-03-09 MED ORDER — HYDROCODONE-ACETAMINOPHEN 5-325 MG PO TABS
1.0000 | ORAL_TABLET | ORAL | Status: DC | PRN
  Administered 2022-03-09: 1 via ORAL
  Administered 2022-03-09: 2 via ORAL
  Filled 2022-03-09: qty 2
  Filled 2022-03-09: qty 1

## 2022-03-09 MED ORDER — POLYETHYLENE GLYCOL 3350 17 G PO PACK: 17.0000 g | PACK | Freq: Every day | ORAL | Status: DC | PRN

## 2022-03-09 MED ORDER — PROPOFOL 500 MG/50ML IV EMUL
INTRAVENOUS | Status: AC
  Filled 2022-03-09: qty 50

## 2022-03-09 MED ORDER — FENOFIBRATE 54 MG PO TABS
54.0000 mg | ORAL_TABLET | Freq: Every day | ORAL | Status: DC
  Administered 2022-03-10: 54 mg via ORAL
  Filled 2022-03-09: qty 1

## 2022-03-09 MED ORDER — GABAPENTIN 400 MG PO CAPS: 800.0000 mg | ORAL_CAPSULE | Freq: Every day | ORAL | Status: DC | PRN

## 2022-03-09 MED ORDER — KETOROLAC TROMETHAMINE 30 MG/ML IJ SOLN
INTRAMUSCULAR | Status: DC | PRN
  Administered 2022-03-09: 30 mg

## 2022-03-09 MED ORDER — MORPHINE SULFATE (PF) 2 MG/ML IV SOLN
0.5000 mg | INTRAVENOUS | Status: DC | PRN
  Administered 2022-03-09 – 2022-03-10 (×3): 1 mg via INTRAVENOUS
  Filled 2022-03-09 (×3): qty 1

## 2022-03-09 MED ORDER — EPHEDRINE 5 MG/ML INJ
INTRAVENOUS | Status: AC
  Filled 2022-03-09: qty 5

## 2022-03-09 MED ORDER — ONDANSETRON HCL 4 MG/2ML IJ SOLN
4.0000 mg | Freq: Four times a day (QID) | INTRAMUSCULAR | Status: DC | PRN
  Administered 2022-03-09 – 2022-03-10 (×2): 4 mg via INTRAVENOUS
  Filled 2022-03-09 (×2): qty 2

## 2022-03-09 MED ORDER — MIDAZOLAM HCL 5 MG/5ML IJ SOLN
INTRAMUSCULAR | Status: DC | PRN
  Administered 2022-03-09: 2 mg via INTRAVENOUS

## 2022-03-09 MED ORDER — ONDANSETRON HCL 4 MG/2ML IJ SOLN
INTRAMUSCULAR | Status: DC | PRN
  Administered 2022-03-09: 4 mg via INTRAVENOUS

## 2022-03-09 MED ORDER — DEXAMETHASONE SODIUM PHOSPHATE 10 MG/ML IJ SOLN
INTRAMUSCULAR | Status: DC | PRN
  Administered 2022-03-09: 8 mg via INTRAVENOUS

## 2022-03-09 MED ORDER — FENTANYL CITRATE (PF) 100 MCG/2ML IJ SOLN
INTRAMUSCULAR | Status: DC | PRN
  Administered 2022-03-09: 50 ug via INTRAVENOUS
  Administered 2022-03-09 (×2): 25 ug via INTRAVENOUS

## 2022-03-09 SURGICAL SUPPLY — 54 items
ACE SHELL 54 4H HIP (Shell) ×1 IMPLANT
BAG COUNTER SPONGE SURGICOUNT (BAG) IMPLANT
BAG DECANTER FOR FLEXI CONT (MISCELLANEOUS) IMPLANT
BAG ZIPLOCK 12X15 (MISCELLANEOUS) IMPLANT
CHLORAPREP W/TINT 26 (MISCELLANEOUS) ×1 IMPLANT
COVER PERINEAL POST (MISCELLANEOUS) ×1 IMPLANT
COVER SURGICAL LIGHT HANDLE (MISCELLANEOUS) ×1 IMPLANT
DERMABOND ADVANCED .7 DNX12 (GAUZE/BANDAGES/DRESSINGS) ×2 IMPLANT
DERMABOND ADVANCED .7 DNX6 (GAUZE/BANDAGES/DRESSINGS) IMPLANT
DRAPE IMP U-DRAPE 54X76 (DRAPES) ×1 IMPLANT
DRAPE SHEET LG 3/4 BI-LAMINATE (DRAPES) ×3 IMPLANT
DRAPE STERI IOBAN 125X83 (DRAPES) ×1 IMPLANT
DRAPE U-SHAPE 47X51 STRL (DRAPES) ×2 IMPLANT
DRSG AQUACEL AG ADV 3.5X10 (GAUZE/BANDAGES/DRESSINGS) ×1 IMPLANT
ELECT REM PT RETURN 15FT ADLT (MISCELLANEOUS) ×1 IMPLANT
GAUZE SPONGE 4X4 12PLY STRL (GAUZE/BANDAGES/DRESSINGS) ×1 IMPLANT
GLOVE BIO SURGEON STRL SZ8.5 (GLOVE) ×2 IMPLANT
GLOVE BIOGEL M 7.0 STRL (GLOVE) ×1 IMPLANT
GLOVE BIOGEL PI IND STRL 7.5 (GLOVE) ×1 IMPLANT
GLOVE BIOGEL PI IND STRL 8.5 (GLOVE) ×1 IMPLANT
GOWN SPEC L3 XXLG W/TWL (GOWN DISPOSABLE) ×1 IMPLANT
GOWN STRL REUS W/ TWL XL LVL3 (GOWN DISPOSABLE) ×1 IMPLANT
GOWN STRL REUS W/TWL XL LVL3 (GOWN DISPOSABLE) ×1
HANDPIECE INTERPULSE COAX TIP (DISPOSABLE) ×1
HEAD CERAMIC BIOLOX 36 (Head) IMPLANT
HOLDER FOLEY CATH W/STRAP (MISCELLANEOUS) ×1 IMPLANT
HOOD PEEL AWAY FLYTE STAYCOOL (MISCELLANEOUS) ×3 IMPLANT
KIT TURNOVER KIT A (KITS) IMPLANT
LINER ACETAB NN G7 F 36 (Liner) IMPLANT
MANIFOLD NEPTUNE II (INSTRUMENTS) ×1 IMPLANT
MARKER SKIN DUAL TIP RULER LAB (MISCELLANEOUS) ×1 IMPLANT
NDL SAFETY ECLIP 18X1.5 (MISCELLANEOUS) ×1 IMPLANT
NDL SPNL 18GX3.5 QUINCKE PK (NEEDLE) ×1 IMPLANT
NEEDLE SPNL 18GX3.5 QUINCKE PK (NEEDLE) ×1 IMPLANT
PACK ANTERIOR HIP CUSTOM (KITS) ×1 IMPLANT
PENCIL SMOKE EVACUATOR (MISCELLANEOUS) IMPLANT
SAW OSC TIP CART 19.5X105X1.3 (SAW) ×1 IMPLANT
SEALER BIPOLAR AQUA 6.0 (INSTRUMENTS) ×1 IMPLANT
SET HNDPC FAN SPRY TIP SCT (DISPOSABLE) ×1 IMPLANT
SHELL ACETAB 54 4H HIP (Shell) IMPLANT
SOLUTION PRONTOSAN WOUND 350ML (IRRIGATION / IRRIGATOR) ×1 IMPLANT
SPIKE FLUID TRANSFER (MISCELLANEOUS) ×1 IMPLANT
STEM FEMORAL TAPERLOC 13X111 (Stem) IMPLANT
SUT MNCRL AB 3-0 PS2 18 (SUTURE) ×1 IMPLANT
SUT MON AB 2-0 CT1 36 (SUTURE) ×1 IMPLANT
SUT STRATAFIX PDO 1 14 VIOLET (SUTURE) ×1
SUT STRATFX PDO 1 14 VIOLET (SUTURE) ×1
SUT VIC AB 2-0 CT1 27 (SUTURE)
SUT VIC AB 2-0 CT1 TAPERPNT 27 (SUTURE) IMPLANT
SUTURE STRATFX PDO 1 14 VIOLET (SUTURE) ×1 IMPLANT
SYR 3ML LL SCALE MARK (SYRINGE) ×1 IMPLANT
TRAY FOLEY MTR SLVR 16FR STAT (SET/KITS/TRAYS/PACK) IMPLANT
TUBE SUCTION HIGH CAP CLEAR NV (SUCTIONS) ×1 IMPLANT
WATER STERILE IRR 1000ML POUR (IV SOLUTION) ×1 IMPLANT
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
## 2022-03-09 NOTE — Plan of Care (Signed)
  Problem: Education: Goal: Knowledge of the prescribed therapeutic regimen will improve Outcome: Progressing   Problem: Activity: Goal: Ability to avoid complications of mobility impairment will improve Outcome: Progressing   Problem: Pain Management: Goal: Pain level will decrease with appropriate interventions Outcome: Progressing   Problem: Education: Goal: Knowledge of General Education information will improve Description: Including pain rating scale, medication(s)/side effects and non-pharmacologic comfort measures Outcome: Progressing   Problem: Activity: Goal: Risk for activity intolerance will decrease Outcome: Progressing   Problem: Nutrition: Goal: Adequate nutrition will be maintained Outcome: Progressing   Problem: Elimination: Goal: Will not experience complications related to bowel motility Outcome: Progressing   Problem: Pain Managment: Goal: General experience of comfort will improve Outcome: Progressing   

## 2022-03-09 NOTE — Discharge Instructions (Signed)
? ?Dr. Brian Swinteck ?Joint Replacement Specialist ?[facility] ?[address] ?[place], [place] [address] ?[phone] ? ? ?TOTAL HIP REPLACEMENT POSTOPERATIVE DIRECTIONS ? ? ? ?Hip Rehabilitation, Guidelines Following Surgery  ? ?WEIGHT BEARING ?Weight bearing as tolerated with assist device (walker, cane, etc) as directed, use it as long as suggested by your surgeon or therapist, typically at least 4-6 weeks. ? ?The results of a hip operation are greatly improved after range of motion and muscle strengthening exercises. Follow all safety measures which are given to protect your hip. If any of these exercises cause increased pain or swelling in your joint, decrease the amount until you are comfortable again. Then slowly increase the exercises. Call your caregiver if you have problems or questions.  ? ?HOME CARE INSTRUCTIONS  ?Most of the following instructions are designed to prevent the dislocation of your new hip.  ?Remove items at home which could result in a fall. This includes throw rugs or furniture in walking pathways.  ?Continue medications as instructed at time of discharge. ?You may have some home medications which will be placed on hold until you complete the course of blood thinner medication. ?You may start showering once you are discharged home. Do not remove your dressing. ?Do not put on socks or shoes without following the instructions of your caregivers.   ?Sit on chairs with arms. Use the chair arms to help push yourself up when arising.  ?Arrange for the use of a toilet seat elevator so you are not sitting low.  ?Walk with walker as instructed.  ?You may resume a sexual relationship in one month or when given the OK by your caregiver.  ?Use walker as long as suggested by your caregivers.  ?You may put full weight on your legs and walk as much as is comfortable. ?Avoid periods of inactivity such as sitting longer than an hour when not asleep. This helps prevent blood  clots.  ?You may return to work once you are cleared by your surgeon.  ?Do not drive a car for 6 weeks or until released by your surgeon.  ?Do not drive while taking narcotics.  ?Wear elastic stockings for two weeks following surgery during the day but you may remove then at night.  ?Make sure you keep all of your appointments after your operation with all of your doctors and caregivers. You should call the office at the above phone number and make an appointment for approximately two weeks after the date of your surgery. ?Please pick up a stool softener and laxative for home use as long as you are requiring pain medications. ?ICE to the affected hip every three hours for 30 minutes at a time and then as needed for pain and swelling. Continue to use ice on the hip for pain and swelling from surgery. You may notice swelling that will progress down to the foot and ankle.  This is normal after surgery.  Elevate the leg when you are not up walking on it.   ?It is important for you to complete the blood thinner medication as prescribed by your doctor. ?Continue to use the breathing machine which will help keep your temperature down.  It is common for your temperature to cycle up and down following surgery, especially at night when you are not up moving around and exerting yourself.  The breathing machine keeps your lungs expanded and your temperature down. ? ?RANGE OF MOTION AND STRENGTHENING EXERCISES  ?These exercises are designed to help you   keep full movement of your hip joint. Follow your caregiver's or physical therapist's instructions. Perform all exercises about fifteen times, three times per day or as directed. Exercise both hips, even if you have had only one joint replacement. These exercises can be done on a training (exercise) mat, on the floor, on a table or on a bed. Use whatever works the best and is most comfortable for you. Use music or television while you are exercising so that the exercises are a  pleasant break in your day. This will make your life better with the exercises acting as a break in routine you can look forward to.  ?Lying on your back, slowly slide your foot toward your buttocks, raising your knee up off the floor. Then slowly slide your foot back down until your leg is straight again.  ?Lying on your back spread your legs as far apart as you can without causing discomfort.  ?Lying on your side, raise your upper leg and foot straight up from the floor as far as is comfortable. Slowly lower the leg and repeat.  ?Lying on your back, tighten up the muscle in the front of your thigh (quadriceps muscles). You can do this by keeping your leg straight and trying to raise your heel off the floor. This helps strengthen the largest muscle supporting your knee.  ?Lying on your back, tighten up the muscles of your buttocks both with the legs straight and with the knee bent at a comfortable angle while keeping your heel on the floor.  ? ?SKILLED REHAB INSTRUCTIONS: ?If the patient is transferred to a skilled rehab facility following release from the hospital, a list of the current medications will be sent to the facility for the patient to continue.  When discharged from the skilled rehab facility, please have the facility set up the patient's Home Health Physical Therapy prior to being released. Also, the skilled facility will be responsible for providing the patient with their medications at time of release from the facility to include their pain medication and their blood thinner medication. If the patient is still at the rehab facility at time of the two week follow up appointment, the skilled rehab facility will also need to assist the patient in arranging follow up appointment in our office and any transportation needs. ? ?POST-OPERATIVE OPIOID TAPER INSTRUCTIONS: ?It is important to wean off of your opioid medication as soon as possible. If you do not need pain medication after your surgery it is ok  to stop day one. ?Opioids include: ?Codeine, Hydrocodone(Norco, Vicodin), Oxycodone(Percocet, oxycontin) and hydromorphone amongst others.  ?Long term and even short term use of opiods can cause: ?Increased pain response ?Dependence ?Constipation ?Depression ?Respiratory depression ?And more.  ?Withdrawal symptoms can include ?Flu like symptoms ?Nausea, vomiting ?And more ?Techniques to manage these symptoms ?Hydrate well ?Eat regular healthy meals ?Stay active ?Use relaxation techniques(deep breathing, meditating, yoga) ?Do Not substitute Alcohol to help with tapering ?If you have been on opioids for less than two weeks and do not have pain than it is ok to stop all together.  ?Plan to wean off of opioids ?This plan should start within one week post op of your joint replacement. ?Maintain the same interval or time between taking each dose and first decrease the dose.  ?Cut the total daily intake of opioids by one tablet each day ?Next start to increase the time between doses. ?The last dose that should be eliminated is the evening dose.  ? ? ?MAKE   SURE YOU:  ?Understand these instructions.  ?Will watch your condition.  ?Will get help right away if you are not doing well or get worse. ? ?Pick up stool softner and laxative for home use following surgery while on pain medications. ?Do not remove your dressing. ?The dressing is waterproof--it is OK to take showers. ?Continue to use ice for pain and swelling after surgery. ?Do not use any lotions or creams on the incision until instructed by your surgeon. ?Total Hip Protocol. ? ?
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
## 2022-03-09 NOTE — Anesthesia Postprocedure Evaluation (Signed)
Anesthesia Post Note  Patient: Clinical research associate  Procedure(s) Performed: TOTAL HIP ARTHROPLASTY ANTERIOR APPROACH (Left: Hip)     Patient location during evaluation: PACU Anesthesia Type: MAC and Spinal Level of consciousness: awake and alert Pain management: pain level controlled Vital Signs Assessment: post-procedure vital signs reviewed and stable Respiratory status: spontaneous breathing, nonlabored ventilation, respiratory function stable and patient connected to nasal cannula oxygen Cardiovascular status: stable and blood pressure returned to baseline Postop Assessment: no apparent nausea or vomiting Anesthetic complications: no   No notable events documented.  Last Vitals:  Vitals:   03/09/22 1243 03/09/22 1437  BP: 116/85 116/67  Pulse: (!) 56 72  Resp: 16 17  Temp: 36.5 C (!) 36.3 C  SpO2: 100% 100%    Last Pain:  Vitals:   03/09/22 1603  TempSrc:   PainSc: 8                  Tiarah Shisler P Christ Fullenwider

## 2022-03-09 NOTE — Interval H&P Note (Signed)
History and Physical Interval Note:  03/09/2022 8:29 AM  Jeffery [name]  has presented today for surgery, with the diagnosis of Left hip osteoarthritis.  The various methods of treatment have been discussed with the patient and family. After consideration of risks, benefits and other options for treatment, the patient has consented to  Procedure(s) with comments: [name] (Left) - 150 as a surgical intervention.  The patient's history has been reviewed, patient examined, no change in status, stable for surgery.  I have reviewed the patient's chart and labs.  Questions were answered to the patient's satisfaction.     Hilton Cork Quintasia Theroux

## 2022-03-09 NOTE — Progress Notes (Signed)
Pt refused cpap tonight.  Pt was advised that RT is available all night should he change his mind. 

## 2022-03-09 NOTE — Transfer of Care (Signed)
Immediate Anesthesia Transfer of Care Note  Patient: Jeffery [name]  Procedure(s) Performed: Procedure(s) with comments: TOTAL HIP ARTHROPLASTY ANTERIOR APPROACH (Left) - 150  Patient Location: PACU  Anesthesia Type:Spinal  Level of Consciousness:  sedated, patient cooperative and responds to stimulation  Airway & Oxygen Therapy:Patient Spontanous Breathing and Patient connected to face mask oxgen  Post-op Assessment:  Report given to PACU RN and Post -op Vital signs reviewed and stable  Post vital signs:  Reviewed and stable  Last Vitals:  Vitals:   03/09/22 0645  BP: (!) 148/97  Pulse: 64  Resp: 13  Temp: 36.4 C  SpO2: 78%    Complications: No apparent anesthesia complications

## 2022-03-09 NOTE — Anesthesia Procedure Notes (Signed)
Spinal  Patient location during procedure: OR Start time: 03/09/2022 9:07 AM End time: 03/09/2022 9:07 AM Reason for block: surgical anesthesia Staffing Performed: resident/CRNA  Anesthesiologist: Darral Dash, DO Resident/CRNA: Lavina Hamman, CRNA Performed by: Lavina Hamman, CRNA Authorized by: Darral Dash, DO   Spinal Block Patient position: sitting Prep: Betadine Patient monitoring: heart rate, cardiac monitor, continuous pulse ox and blood pressure Approach: midline Location: L3-4 Injection technique: single-shot Needle Needle type: Spinocan  Needle gauge: 24 G Needle length: 9 cm Needle insertion depth: 7 cm Assessment Sensory level: T6 Events: CSF return Additional Notes -heme, -para, pt tol well.   Lot and exp date OK.

## 2022-03-09 NOTE — Op Note (Signed)
OPERATIVE REPORT  SURGEON: Rod Can, MD   ASSISTANT: Larene Pickett, PA-C.  PREOPERATIVE DIAGNOSIS: Left hip arthritis.   POSTOPERATIVE DIAGNOSIS: Left hip arthritis.   PROCEDURE: Left total hip arthroplasty, anterior approach.   IMPLANTS: Biomet Taperloc Complete Microplasty stem, size 13 x 162m, high offset. Biomet G7 OsseoTi Cup, size 54 mm. Biomet Vivacit-E liner, size 36 mm, F, neutral. Biomet Biolox ceramic head ball, size 36 - 3 mm.  ANESTHESIA:  MAC and Spinal  ESTIMATED BLOOD LOSS:-600 mL    ANTIBIOTICS: 2g Ancef.  DRAINS: None.  COMPLICATIONS: None.   CONDITION: PACU - hemodynamically stable.   BRIEF CLINICAL NOTE: Jeffery Cooksonis a 61y.o. male with a long-standing history of Left hip arthritis. After failing conservative management, the patient was indicated for total hip arthroplasty. The risks, benefits, and alternatives to the procedure were explained, and the patient elected to proceed.  PROCEDURE IN DETAIL: Surgical site was marked by myself in the pre-op holding area. Once inside the operating room, spinal anesthesia was obtained, and a foley catheter was inserted. The patient was then positioned on the Hana table.  All bony prominences were well padded.  The hip was prepped and draped in the normal sterile surgical fashion.  A time-out was called verifying side and site of surgery. The patient received IV antibiotics within 60 minutes of beginning the procedure.   Bikini incision was made, and superficial dissection was performed lateral to the ASIS. The direct anterior approach to the hip was performed through the Hueter interval.  Lateral femoral circumflex vessels were treated with the Auqumantys. The anterior capsule was exposed and an inverted T capsulotomy was made. The femoral neck cut was made to the level of the templated cut.  A corkscrew was placed into the head and the head was removed.  The femoral head was found to have eburnated bone. The head was  passed to the back table and was measured. Pubofemoral ligament was released off of the calcar, taking care to stay on bone. Superior capsule was released from the greater trochanter, taking care to stay lateral to the posterior border of the femoral neck in order to preserve the short external rotators.   Acetabular exposure was achieved, and the pulvinar and labrum were excised. Sequential reaming of the acetabulum was then performed up to a size 53 mm reamer. A 54 mm cup was then opened and impacted into place at approximately 40 degrees of abduction and 20 degrees of anteversion. The final polyethylene liner was impacted into place and acetabular osteophytes were removed.    I then gained femoral exposure taking care to protect the abductors and greater trochanter.  This was performed using standard external rotation, extension, and adduction.  A cookie cutter was used to enter the femoral canal, and then the femoral canal finder was placed.  Sequential broaching was performed up to a size 13.  Calcar planer was used on the femoral neck remnant.  I placed a high offset neck and a trial head ball.  The hip was reduced.  Leg lengths and offset were checked fluoroscopically.  The hip was dislocated and trial components were removed.  The final implants were placed, and the hip was reduced.  Fluoroscopy was used to confirm component position and leg lengths.  At 90 degrees of external rotation and full extension, the hip was stable to an anterior directed force.   The wound was copiously irrigated with Prontosan solution and normal saline using pule lavage.  Marcaine solution  was injected into the periarticular soft tissue.  The wound was closed in layers using #1 Vicryl and V-Loc for the fascia, 2-0 Vicryl for the subcutaneous fat, 2-0 Monocryl for the deep dermal layer, 3-0 running Monocryl subcuticular stitch, and Dermabond for the skin.  Once the glue was fully dried, an Aquacell Ag dressing was applied.   The patient was transported to the recovery room in stable condition.  Sponge, needle, and instrument counts were correct at the end of the case x2.  The patient tolerated the procedure well and there were no known complications.  Please note that a surgical assistant was a medical necessity for this procedure to perform it in a safe and expeditious manner. Assistant was necessary to provide appropriate retraction of vital neurovascular structures, to prevent femoral fracture, and to allow for anatomic placement of the prosthesis.

## 2022-03-10 ENCOUNTER — Encounter (HOSPITAL_COMMUNITY): Payer: Self-pay | Admitting: Orthopedic Surgery

## 2022-03-10 ENCOUNTER — Other Ambulatory Visit: Payer: Self-pay

## 2022-03-10 DIAGNOSIS — M1612 Unilateral primary osteoarthritis, left hip: Secondary | ICD-10-CM | POA: Diagnosis not present

## 2022-03-10 LAB — CBC
HCT: 36.1 % — ABNORMAL LOW (ref 39.0–52.0)
Hemoglobin: 12 g/dL — ABNORMAL LOW (ref 13.0–17.0)
MCH: 32 pg (ref 26.0–34.0)
MCHC: 33.2 g/dL (ref 30.0–36.0)
MCV: 96.3 fL (ref 80.0–100.0)
Platelets: 183 10*3/uL (ref 150–400)
RBC: 3.75 MIL/uL — ABNORMAL LOW (ref 4.22–5.81)
RDW: 12.2 % (ref 11.5–15.5)
WBC: 18.6 10*3/uL — ABNORMAL HIGH (ref 4.0–10.5)
nRBC: 0 % (ref 0.0–0.2)

## 2022-03-10 LAB — BASIC METABOLIC PANEL
Anion gap: 7 (ref 5–15)
BUN: 11 mg/dL (ref 6–20)
CO2: 27 mmol/L (ref 22–32)
Calcium: 8.7 mg/dL — ABNORMAL LOW (ref 8.9–10.3)
Chloride: 106 mmol/L (ref 98–111)
Creatinine, Ser: 1.13 mg/dL (ref 0.61–1.24)
GFR, Estimated: >60 mL/min (ref >60)
Glucose, Bld: 142 mg/dL — ABNORMAL HIGH (ref 70–99)
Potassium: 3.7 mmol/L (ref 3.5–5.1)
Sodium: 140 mmol/L (ref 135–145)

## 2022-03-10 MED ORDER — METHOCARBAMOL 500 MG PO TABS: 500.0000 mg | ORAL_TABLET | Freq: Four times a day (QID) | ORAL | 0 refills | Status: AC | PRN

## 2022-03-10 MED ORDER — ONDANSETRON HCL 4 MG PO TABS: 4.0000 mg | ORAL_TABLET | Freq: Three times a day (TID) | ORAL | 0 refills | Status: AC | PRN

## 2022-03-10 MED ORDER — ASPIRIN 81 MG PO CHEW: 81.0000 mg | CHEWABLE_TABLET | Freq: Two times a day (BID) | ORAL | 0 refills | Status: AC

## 2022-03-10 MED ORDER — DOCUSATE SODIUM 100 MG PO CAPS: 100.0000 mg | ORAL_CAPSULE | Freq: Two times a day (BID) | ORAL | 0 refills | Status: AC

## 2022-03-10 MED ORDER — POLYETHYLENE GLYCOL 3350 17 G PO PACK: 17.0000 g | PACK | Freq: Every day | ORAL | 0 refills | Status: AC | PRN

## 2022-03-10 MED ORDER — OXYCODONE HCL 5 MG PO TABS: 5.0000 mg | ORAL_TABLET | ORAL | 0 refills | Status: AC | PRN

## 2022-03-10 MED ORDER — SENNA 8.6 MG PO TABS: 2.0000 | ORAL_TABLET | Freq: Every day | ORAL | 0 refills | Status: AC

## 2022-03-10 NOTE — Progress Notes (Signed)
    Subjective:  Patient reports pain as mild to moderate.  Denies N/V/CP/SOB/Abd pain. He states his shoulder and back pain has flared up overnight.  Had issues with pain control overnight. Incident overnight detailed in separate note, see Danna Franklin's note. Patient's pain is more controlled today and calm.   Objective:   VITALS:   Vitals:   03/09/22 1741 03/09/22 2214 03/10/22 0206 03/10/22 0649  BP: 104/77 (!) 145/84 125/82 126/84  Pulse: 73 92 82 72  Resp: '18 17 17 17  '$ Temp: 97.8 F (36.6 C) 98.6 F (37 C) 98.3 F (36.8 C) 98.1 F (36.7 C)  TempSrc: Oral  Oral   SpO2: 98% 97% 100% 100%  Weight:      Height:        Patient lying comfortably in bed. NAD.  Neurologically intact ABD soft Neurovascular intact Sensation intact distally Intact pulses distally Dorsiflexion/Plantar flexion intact Incision: dressing C/D/I No cellulitis present Compartment soft   Lab Results  Component Value Date   WBC 18.6 (H) 03/10/2022   HGB 12.0 (L) 03/10/2022   HCT 36.1 (L) 03/10/2022   MCV 96.3 03/10/2022   PLT 183 03/10/2022   BMET    Component Value Date/Time   NA 140 03/10/2022 0333   NA 143 12/12/2018 1423   K 3.7 03/10/2022 0333   CL 106 03/10/2022 0333   CO2 27 03/10/2022 0333   GLUCOSE 142 (H) 03/10/2022 0333   BUN 11 03/10/2022 0333   BUN 18 12/12/2018 1423   CREATININE 1.13 03/10/2022 0333   CALCIUM 8.7 (L) 03/10/2022 0333   GFRNONAA >60 03/10/2022 0333     Assessment/Plan: 1 Day Post-Op   Principal Problem:   Primary osteoarthritis of left hip Active Problems:   S/P total left hip arthroplasty   WBAT with walker DVT ppx: Aspirin, SCDs, TEDS PO pain control PT/OT: Patient ambulated 80 feet with PT yesterday. Continue PT today.  Dispo: D/c home with HEP once cleared with PT. Patient will follow-up in office in 2 weeks.    Charlott Rakes, PA-C 03/10/2022, 7:23 AM   [facility]  [place] [address], [place], [place]  [address] Phone: [phone] [email or web address] Facebook  [employer]

## 2022-03-10 NOTE — Plan of Care (Signed)
  Problem: Nutrition: Goal: Adequate nutrition will be maintained Outcome: Progressing   
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
## 2022-03-10 NOTE — TOC Transition Note (Signed)
Transition of Care [facility]) - CM/SW Discharge Note   Patient Details  Name: Jeffery [name] MRN: [id number] Date of Birth: [birth year]  Transition of Care [facility]) CM/SW Contact:  Lennart Pall, LCSW Phone Number: 03/10/2022, 9:51 AM   Clinical Narrative:    Met with pt who reports need for RW - no DME agency preference - order place with [employer] and RW delivered to room.  Plan for HEP.  No further TOC needs.   Final next level of care: Home/Self Care Barriers to Discharge: No Barriers Identified   Patient Goals and CMS Choice Patient states their goals for this hospitalization and ongoing recovery are:: return home      Discharge Placement                       Discharge Plan and Services                DME Arranged: Walker rolling DME Agency: [employer] Date DME Agency Contacted: 03/10/22 Time DME Agency Contacted: [phone]              Social Determinants of Health (SDOH) Interventions     Readmission Risk Interventions     No data to display

## 2022-03-10 NOTE — Progress Notes (Signed)
Pt has passed PT and is ready for discharge.  PIV removed. TED stockings applied. Pt remains alert and oriented.  AVS instructions given verbally and pt able to verbalize understanding of all discharge instructions, including wound care.  All questions answered.  Pt AVS highlighted for care giver and pt made aware.
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
## 2022-03-10 NOTE — Plan of Care (Signed)
  Problem: Education: Goal: Knowledge of the prescribed therapeutic regimen will improve Outcome: Progressing Goal: Understanding of discharge needs will improve Outcome: Progressing Goal: Individualized Educational Video(s) Outcome: Progressing   Problem: Activity: Goal: Ability to avoid complications of mobility impairment will improve Outcome: Progressing Goal: Ability to tolerate increased activity will improve Outcome: Progressing   Problem: Clinical Measurements: Goal: Postoperative complications will be avoided or minimized Outcome: Progressing   Problem: Pain Management: Goal: Pain level will decrease with appropriate interventions Outcome: Progressing   Problem: Education: Goal: Knowledge of General Education information will improve Description: Including pain rating scale, medication(s)/side effects and non-pharmacologic comfort measures Outcome: Progressing   Problem: Health Behavior/Discharge Planning: Goal: Ability to manage health-related needs will improve Outcome: Progressing   Problem: Clinical Measurements: Goal: Ability to maintain clinical measurements within normal limits will improve Outcome: Progressing Goal: Will remain free from infection Outcome: Progressing Goal: Diagnostic test results will improve Outcome: Progressing Goal: Respiratory complications will improve Outcome: Progressing Goal: Cardiovascular complication will be avoided Outcome: Progressing   Problem: Activity: Goal: Risk for activity intolerance will decrease Outcome: Progressing   Problem: Nutrition: Goal: Adequate nutrition will be maintained Outcome: Progressing   Problem: Coping: Goal: Level of anxiety will decrease Outcome: Progressing   Problem: Elimination: Goal: Will not experience complications related to bowel motility Outcome: Progressing Goal: Will not experience complications related to urinary retention Outcome: Progressing   Problem: Pain  Managment: Goal: General experience of comfort will improve Outcome: Progressing   Problem: Safety: Goal: Ability to remain free from injury will improve Outcome: Progressing
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
## 2022-07-06 ENCOUNTER — Ambulatory Visit (INDEPENDENT_AMBULATORY_CARE_PROVIDER_SITE_OTHER): Payer: 59 | Admitting: Adult Health

## 2022-07-06 ENCOUNTER — Encounter: Payer: Self-pay | Admitting: Adult Health

## 2022-07-06 VITALS — BP 118/76 | HR 65 | Ht 64.0 in | Wt 194.0 lb

## 2022-07-06 DIAGNOSIS — G4733 Obstructive sleep apnea (adult) (pediatric): Secondary | ICD-10-CM

## 2023-02-28 ENCOUNTER — Emergency Department (HOSPITAL_COMMUNITY)
Admission: EM | Admit: 2023-02-28 | Discharge: 2023-02-28 | Disposition: A | Discharge status: Home / Self Care | Payer: Medicare HMO | Attending: Emergency Medicine | Admitting: Emergency Medicine

## 2023-02-28 ENCOUNTER — Other Ambulatory Visit: Payer: Self-pay

## 2023-02-28 DIAGNOSIS — L0231 Cutaneous abscess of buttock: Secondary | ICD-10-CM | POA: Diagnosis present

## 2023-02-28 MED ORDER — LIDOCAINE-EPINEPHRINE (PF) 2 %-1:200000 IJ SOLN
10.0000 mL | Freq: Once | INTRAMUSCULAR | Status: AC
  Administered 2023-02-28: 10 mL
  Filled 2023-02-28: qty 20

## 2023-02-28 MED ORDER — DOXYCYCLINE HYCLATE 100 MG PO CAPS: 100.0000 mg | ORAL_CAPSULE | Freq: Two times a day (BID) | ORAL | 0 refills | Status: AC

## 2023-02-28 MED ORDER — DOXYCYCLINE HYCLATE 100 MG PO CAPS: 100.0000 mg | ORAL_CAPSULE | Freq: Two times a day (BID) | ORAL | 0 refills | Status: DC

## 2023-02-28 NOTE — ED Triage Notes (Signed)
Patient arrives for eval of a golf-ball sized abscess to L buttocks. Present x 1 week but draining only since yesterday when he used a hot compress for the first time. Denies fevers or chills.

## 2023-02-28 NOTE — ED Provider Notes (Signed)
[place] EMERGENCY DEPARTMENT AT [facility] Provider Note   CSN: [id number] Arrival date & time: 02/28/23  4098     History  Chief Complaint  Patient presents with   Abscess    Jeffery [name] is a 62 y.o. male.  62 y.o. male presenting with 1 to 2 weeks of pain on his left buttock that developed into an abscess.  He reports pain increased 2 days ago.  He used a warm compress at home and felt the abscess popped and drained a little bit.  Since then he has had increased pain on the buttock area.  Denies fever, chills.   Abscess Associated symptoms: no fatigue, no fever, no nausea and no vomiting        Home Medications Prior to Admission medications   Medication Sig Start Date End Date Taking? Authorizing Provider  albuterol (ACCUNEB) 1.25 MG/3ML nebulizer solution Take 1 ampule by nebulization every 6 (six) hours as needed for wheezing or shortness of breath.    [provider]  albuterol (VENTOLIN HFA) 108 (90 Base) MCG/ACT inhaler Inhale 2 puffs into the lungs every 6 (six) hours as needed for wheezing or shortness of breath.    [provider]  diclofenac Sodium (VOLTAREN) 1 % GEL Apply 1 Application topically 4 (four) times daily as needed for pain. 01/19/22   [provider]  doxycycline (VIBRAMYCIN) 100 MG capsule Take 1 capsule (100 mg total) by mouth 2 (two) times daily for 7 days. 02/28/23 03/07/23  Glendale Chard, DO  fenofibrate (TRICOR) 48 MG tablet Take 48 mg by mouth daily.    [provider]  gabapentin (NEURONTIN) 800 MG tablet Take 800 mg by mouth daily as needed (pain).    [provider]  losartan-hydrochlorothiazide (HYZAAR) 100-12.5 MG tablet Take 1 tablet by mouth daily.    [provider]  methocarbamol (ROBAXIN) 500 MG tablet Take 1 tablet (500 mg total) by mouth every 6 (six) hours as needed for muscle spasms. 03/10/22   Hill, Alain Honey, PA-C  omeprazole (PRILOSEC) 40 MG capsule Take 40 mg by mouth  daily as needed (acid reflux).    [provider]  ondansetron (ZOFRAN) 4 MG tablet Take 1 tablet (4 mg total) by mouth every 8 (eight) hours as needed for nausea or vomiting. 03/10/22 03/10/23  Clois Dupes, PA-C  oxyCODONE (ROXICODONE) 5 MG immediate release tablet Take 1 tablet (5 mg total) by mouth every 4 (four) hours as needed for severe pain. 03/10/22   Clois Dupes, PA-C  Vitamin D, Ergocalciferol, (DRISDOL) 1.25 MG (50000 UNIT) CAPS capsule Take 50,000 Units by mouth every Friday.    [provider]      Allergies    Patient has no known allergies.    Review of Systems   Review of Systems  Constitutional:  Negative for activity change, appetite change, chills, fatigue and fever.  Respiratory:  Negative for cough and shortness of breath.   Gastrointestinal:  Negative for nausea and vomiting.  Genitourinary:  Negative for decreased urine volume, difficulty urinating and urgency.  Musculoskeletal:  Negative for myalgias.    Physical Exam Updated Vital Signs BP (!) 112/90 (BP Location: Right Arm)   Pulse 70   Temp 98.3 F (36.8 C) (Oral)   Resp 18   Ht 5\' 6"  (1.676 m)   Wt 88.9 kg   SpO2 100%   BMI 31.64 kg/m  Physical Exam Constitutional:      General: He is not in  acute distress.    Appearance: Normal appearance. He is normal weight. He is not ill-appearing.  Cardiovascular:     Rate and Rhythm: Normal rate and regular rhythm.  Pulmonary:     Effort: Pulmonary effort is normal.     Breath sounds: Normal breath sounds.  Abdominal:     General: Abdomen is flat. Bowel sounds are normal.     Palpations: Abdomen is soft.  Skin:    General: Skin is warm.     Capillary Refill: Capillary refill takes less than 2 seconds.     Comments: 3 cm abscess with erythema, tenderness to palpation  Neurological:     General: No focal deficit present.     Mental Status: He is alert and oriented to person, place, and time. Mental status is at baseline.     ED  Results / Procedures / Treatments   Labs (all labs ordered are listed, but only abnormal results are displayed) Labs Reviewed - No data to display  EKG None  Radiology No results found.  Procedures .Marland KitchenIncision and Drainage  Date/Time: 02/28/2023 12:48 PM  Performed by: Glendale Chard, DO Authorized by: Jacalyn Lefevre, MD   Consent:    Consent obtained:  Verbal   Consent given by:  Patient   Risks, benefits, and alternatives were discussed: yes     Risks discussed:  Incomplete drainage, bleeding, pain and infection Universal protocol:    Procedure explained and questions answered to patient or proxy's satisfaction: yes     Relevant documents present and verified: yes     Test results available : no     Imaging studies available: no     Required blood products, implants, devices, and special equipment available: no     Site/side marked: no     Immediately prior to procedure, a time out was called: yes     Patient identity confirmed:  Verbally with patient Location:    Type:  Abscess   Size:  3 cm   Location:  Lower extremity   Lower extremity location:  Buttock   Buttock location:  L buttock Pre-procedure details:    Skin preparation:  Povidone-iodine Sedation:    Sedation type:  None Anesthesia:    Anesthesia method:  Local infiltration   Local anesthetic:  Lidocaine 1% WITH epi Procedure type:    Complexity:  Simple Procedure details:    Ultrasound guidance: no     Needle aspiration: no     Incision types:  Stab incision   Incision depth:  Dermal   Wound management:  Probed and deloculated   Drainage:  Bloody and purulent   Drainage amount:  Scant   Wound treatment:  Wound left open   Packing materials:  None Post-procedure details:    Procedure completion:  Tolerated well, no immediate complications     Medications Ordered in ED Medications  lidocaine-EPINEPHrine (XYLOCAINE W/EPI) 2 %-1:200000 (PF) injection 10 mL (10 mLs Infiltration Given by Other  02/28/23 1248)    ED Course/ Medical Decision Making/ A&P                                 Medical Decision Making 62 y.o. male presenting with left buttock abscess.  On arrival vital signs stable patient afebrile.  On exam notable 3 cm abscess on the left buttock with erythema and edema.  Appeared to have been expressed at home.  With verbal consent prepped patient for  I&D with iodine solution, used lidocaine with epi for local numbing, performed stab incision, this is probed and deloculated with scant bloody purulent drainage.  Patient tolerated procedure well.  Patient instructed on wound care instructions.  Will send 7-day course of doxycycline 100 mg twice daily to patient's pharmacy.  Patient to follow-up outpatient with PCP.  Risk Prescription drug management.          Final Clinical Impression(s) / ED Diagnoses Final diagnoses:  Abscess of buttock, left    Rx / DC Orders ED Discharge Orders          Ordered    doxycycline (VIBRAMYCIN) 100 MG capsule  2 times daily,   Status:  Discontinued        02/28/23 1224    doxycycline (VIBRAMYCIN) 100 MG capsule  2 times daily        02/28/23 1240              Glendale Chard, DO 02/28/23 1251    Jacalyn Lefevre, MD 02/28/23 1253

## 2023-05-08 ENCOUNTER — Encounter: Payer: Self-pay | Admitting: *Deleted

## 2023-05-09 ENCOUNTER — Encounter: Payer: Self-pay | Admitting: Neurology

## 2023-05-09 ENCOUNTER — Ambulatory Visit (INDEPENDENT_AMBULATORY_CARE_PROVIDER_SITE_OTHER): Payer: Medicare HMO | Admitting: Neurology

## 2023-05-09 VITALS — BP 144/88 | HR 66 | Ht 66.0 in | Wt 200.0 lb

## 2023-05-09 DIAGNOSIS — G4733 Obstructive sleep apnea (adult) (pediatric): Secondary | ICD-10-CM | POA: Diagnosis not present

## 2023-05-09 DIAGNOSIS — G4489 Other headache syndrome: Secondary | ICD-10-CM

## 2023-05-09 DIAGNOSIS — G4486 Cervicogenic headache: Secondary | ICD-10-CM

## 2023-05-09 DIAGNOSIS — R519 Headache, unspecified: Secondary | ICD-10-CM

## 2023-05-09 DIAGNOSIS — Z9189 Other specified personal risk factors, not elsewhere classified: Secondary | ICD-10-CM
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
## 2023-05-09 NOTE — Patient Instructions (Signed)
It was nice to see you again today.  I will order CT scan of your head. Brain MRI from 2020 did not show any acute changes at the time.  Please follow-up with your pain management specialist and find out if you are still supposed meloxicam, Robaxin and gabapentin.  Will call you with your CT scan results.  Please follow-up as scheduled with the nurse practitioner for your sleep apnea management, try to be consistent with your AutoPap machine, we usually recommend using it all night, every night, not just 4 hours.  Please try to hydrate better with water, we recommend 3-4 bottles of water per day on average, 16.9 ounce size each.

## 2023-05-16 ENCOUNTER — Telehealth: Payer: Self-pay | Admitting: Neurology

## 2023-05-16 NOTE — Telephone Encounter (Signed)
Ethlyn Gallery: [phone] exp. 05/16/23-05/23/23 sent to GI [phone]

## 2023-06-06 ENCOUNTER — Ambulatory Visit
Admission: RE | Admit: 2023-06-06 | Discharge: 2023-06-06 | Disposition: A | Discharge status: Home / Self Care | Payer: Medicare Other | Source: Ambulatory Visit | Attending: Neurology | Admitting: Neurology

## 2023-06-06 DIAGNOSIS — G4486 Cervicogenic headache: Secondary | ICD-10-CM

## 2023-06-06 DIAGNOSIS — Z9189 Other specified personal risk factors, not elsewhere classified: Secondary | ICD-10-CM

## 2023-06-06 DIAGNOSIS — G4489 Other headache syndrome: Secondary | ICD-10-CM

## 2023-06-06 DIAGNOSIS — G4733 Obstructive sleep apnea (adult) (pediatric): Secondary | ICD-10-CM

## 2023-06-06 DIAGNOSIS — R519 Headache, unspecified: Secondary | ICD-10-CM | POA: Diagnosis not present

## 2023-06-27 ENCOUNTER — Telehealth: Payer: Self-pay | Admitting: Neurology

## 2023-06-27 NOTE — Telephone Encounter (Signed)
 Pt called to verify appointment

## 2023-07-13 ENCOUNTER — Ambulatory Visit: Payer: 59 | Admitting: Adult Health

## 2023-08-04 ENCOUNTER — Telehealth: Payer: Self-pay | Admitting: Neurology

## 2023-08-04 NOTE — Telephone Encounter (Signed)
 Pt is asking for a call with results to his CT scan.  Pt states when he last saw Dr Frances Furbish she mentioned referring him to another specialist, he has not heard from anyone.  Please call.

## 2023-08-07 NOTE — Telephone Encounter (Signed)
 I called pt and relayed that CT head was normal.  No referral mentioned other then to see pain management relating to his medications: robaxin, meloxicam, and gabapentin.  He verbalized understanding.  Appreciated call back.   Has appt with pain management next month.

## 2023-08-21 NOTE — Progress Notes (Unsigned)
 Marland Kitchen

## 2023-08-22 ENCOUNTER — Ambulatory Visit (INDEPENDENT_AMBULATORY_CARE_PROVIDER_SITE_OTHER): Payer: 59 | Admitting: Adult Health

## 2023-08-22 ENCOUNTER — Encounter: Payer: Self-pay | Admitting: Adult Health

## 2023-08-22 VITALS — BP 115/78 | HR 57 | Ht 66.0 in | Wt 199.4 lb

## 2023-08-22 DIAGNOSIS — G4733 Obstructive sleep apnea (adult) (pediatric): Secondary | ICD-10-CM | POA: Diagnosis not present

## 2023-08-22 NOTE — Patient Instructions (Signed)
 Continue using CPAP nightly and greater than 4 hours each night If your symptoms worsen or you develop new symptoms please let us know.
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
## 2024-01-15 IMAGING — CR DG LUMBAR SPINE COMPLETE 4+V
5 series · 5 of 5 positions shown · non-contrast
Comparison: None.

CLINICAL DATA: Back pain.

EXAM:
LUMBAR SPINE - COMPLETE 4+ VIEW

[l-spine ap]
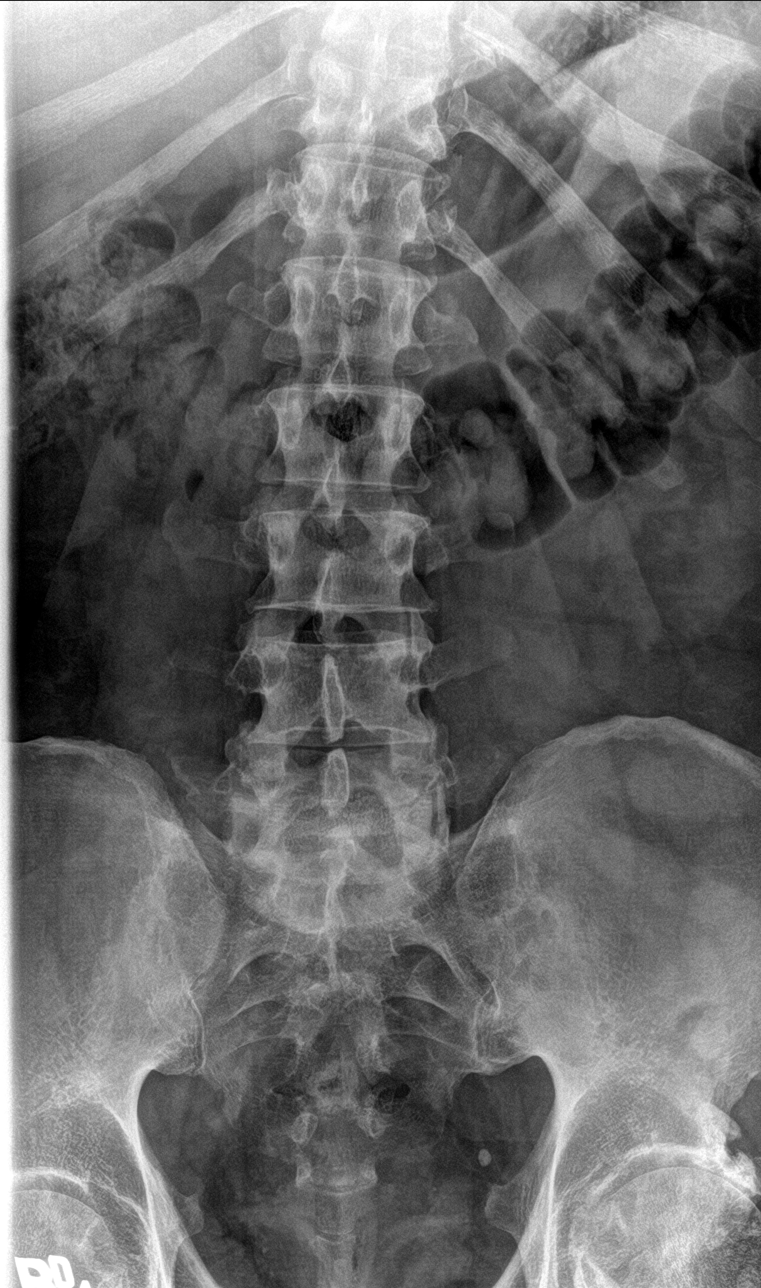

[l-spine obl (1 of 2)]
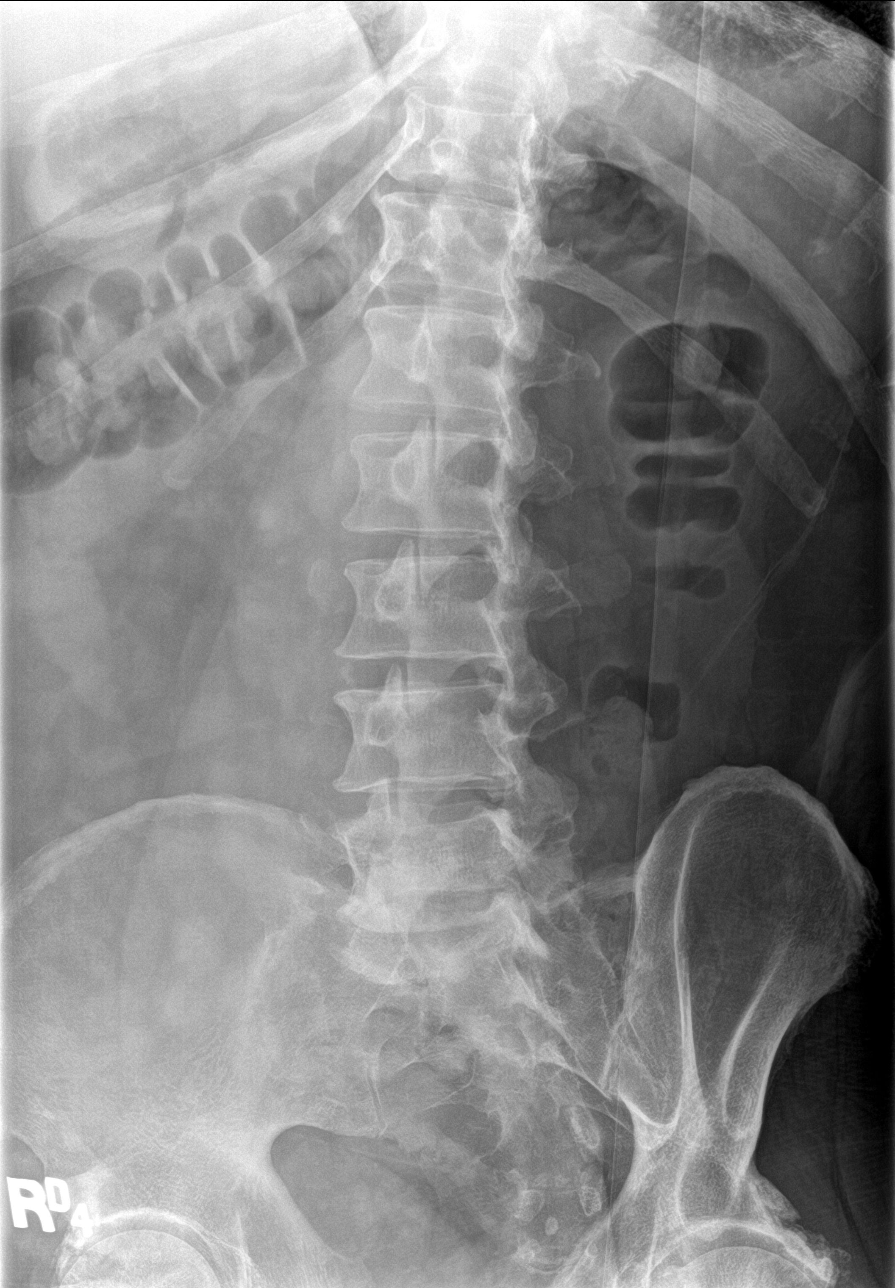

[l-spine obl (2 of 2)]
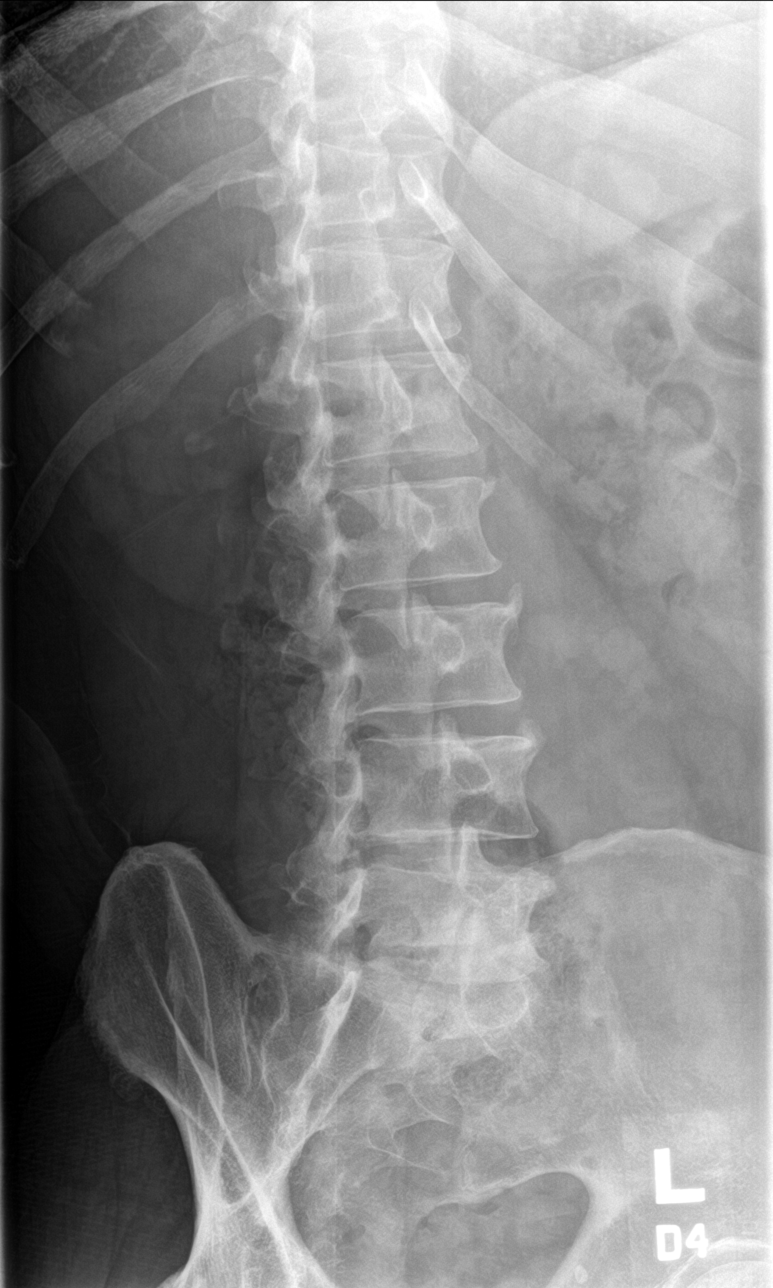

[l-spine lat]
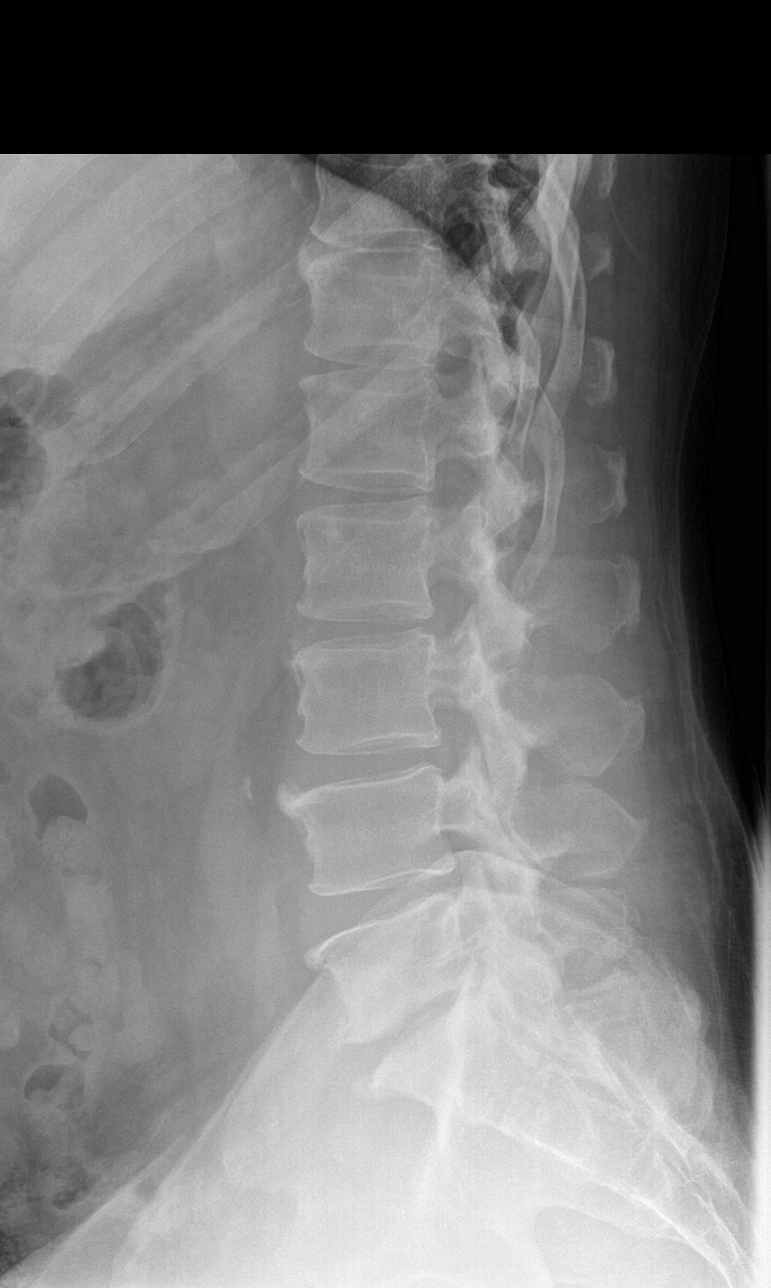

[l-spine spot]
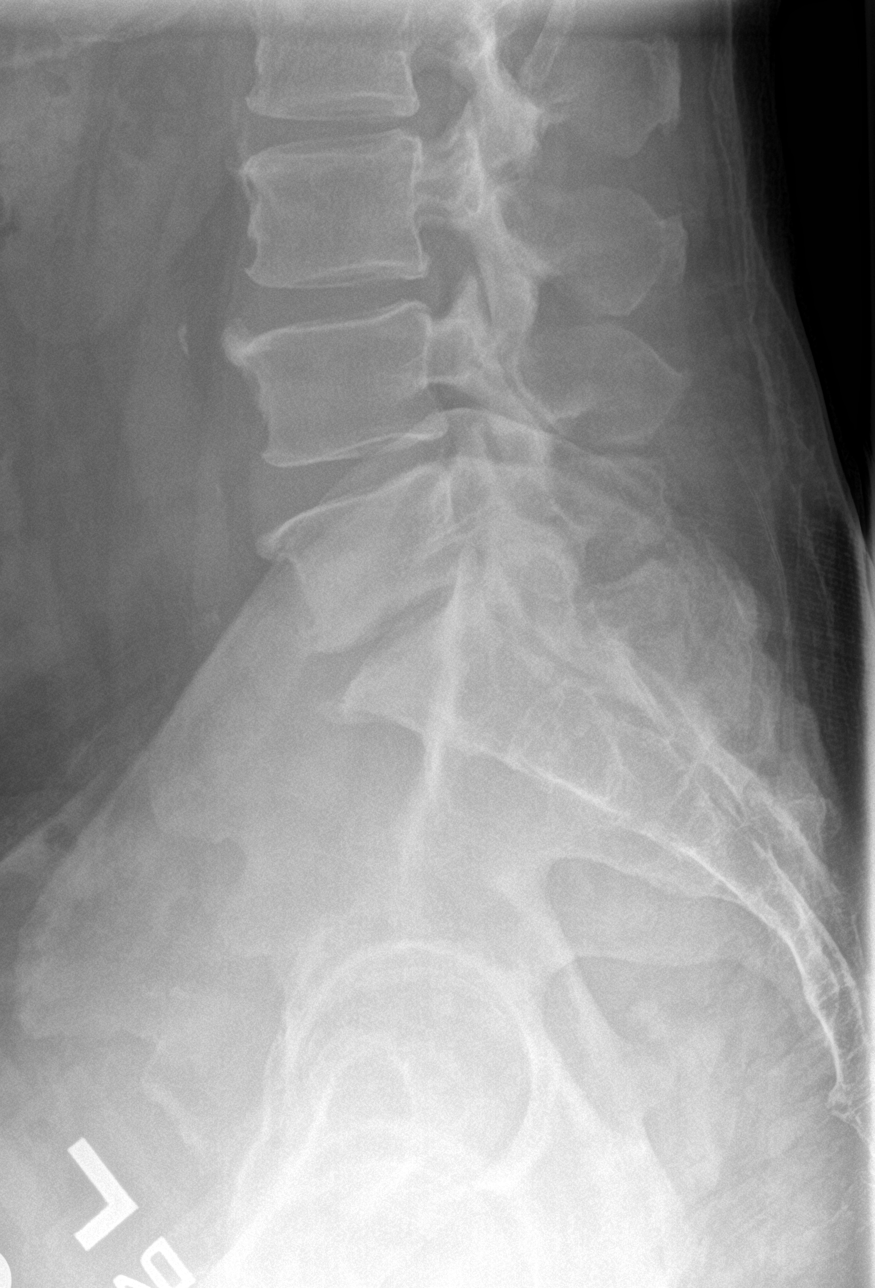

[5 of 5 positions shown; findings below may reference images not displayed]

FINDINGS: Normal alignment. Mild disc space narrowing at L5-S1. Otherwise, the
disc spaces are maintained. Vertebral body heights are maintained.
Degenerative endplate changes particularly at L4 and L5. No definite
pars defect. Joint space narrowing and degenerative changes in the
superior left hip joint.
IMPRESSION: 1. No acute abnormality in lumbar spine.
2. Degenerative changes in lumbar spine particularly at  L5-S1.
3. Evidence for osteoarthritis in the left hip.

## 2024-08-21 ENCOUNTER — Ambulatory Visit: Admitting: Adult Health
